# Patient Record
Sex: Female | Born: 1960
Health system: Southern US, Community
[De-identification: ages and names within clinical notes are randomized; demographics above are authoritative.]

## PROBLEM LIST (undated history)

## (undated) DIAGNOSIS — I10 Essential (primary) hypertension: Secondary | ICD-10-CM

## (undated) DIAGNOSIS — T7840XA Allergy, unspecified, initial encounter: Secondary | ICD-10-CM

## (undated) DIAGNOSIS — H269 Unspecified cataract: Secondary | ICD-10-CM

## (undated) HISTORY — PX: TUBAL LIGATION: SHX77

## (undated) HISTORY — DX: Essential (primary) hypertension: I10

## (undated) HISTORY — PX: POLYPECTOMY: SHX149

## (undated) HISTORY — PX: RETINAL DETACHMENT SURGERY: SHX105

## (undated) HISTORY — DX: Allergy, unspecified, initial encounter: T78.40XA

## (undated) HISTORY — PX: COLONOSCOPY: SHX174

## (undated) HISTORY — PX: OTHER SURGICAL HISTORY: SHX169

## (undated) HISTORY — DX: Unspecified cataract: H26.9

---

## 1998-01-18 ENCOUNTER — Other Ambulatory Visit: Admission: RE | Admit: 1998-01-18 | Discharge: 1998-01-18 | Payer: Self-pay | Admitting: Obstetrics and Gynecology

## 1999-01-31 ENCOUNTER — Other Ambulatory Visit: Admission: RE | Admit: 1999-01-31 | Discharge: 1999-01-31 | Payer: Self-pay | Admitting: Obstetrics and Gynecology

## 1999-02-05 ENCOUNTER — Ambulatory Visit (HOSPITAL_COMMUNITY): Admission: RE | Admit: 1999-02-05 | Discharge: 1999-02-05 | Payer: Self-pay | Admitting: Family Medicine

## 1999-02-05 ENCOUNTER — Encounter: Payer: Self-pay | Admitting: Family Medicine

## 2000-02-12 ENCOUNTER — Other Ambulatory Visit: Admission: RE | Admit: 2000-02-12 | Discharge: 2000-02-12 | Payer: Self-pay | Admitting: Obstetrics and Gynecology

## 2001-06-19 ENCOUNTER — Other Ambulatory Visit: Admission: RE | Admit: 2001-06-19 | Discharge: 2001-06-19 | Payer: Self-pay | Admitting: Obstetrics and Gynecology

## 2002-07-01 ENCOUNTER — Other Ambulatory Visit: Admission: RE | Admit: 2002-07-01 | Discharge: 2002-07-01 | Payer: Self-pay | Admitting: Obstetrics and Gynecology

## 2003-08-24 ENCOUNTER — Other Ambulatory Visit: Admission: RE | Admit: 2003-08-24 | Discharge: 2003-08-24 | Payer: Self-pay | Admitting: Obstetrics and Gynecology

## 2010-12-18 ENCOUNTER — Encounter: Payer: Self-pay | Admitting: Internal Medicine

## 2012-04-23 ENCOUNTER — Encounter: Payer: Self-pay | Admitting: Gastroenterology

## 2014-05-16 ENCOUNTER — Encounter: Payer: Self-pay | Admitting: Gastroenterology

## 2016-07-31 DIAGNOSIS — N390 Urinary tract infection, site not specified: Secondary | ICD-10-CM | POA: Diagnosis not present

## 2016-08-08 DIAGNOSIS — H26493 Other secondary cataract, bilateral: Secondary | ICD-10-CM | POA: Diagnosis not present

## 2016-08-13 DIAGNOSIS — M542 Cervicalgia: Secondary | ICD-10-CM | POA: Diagnosis not present

## 2016-08-13 DIAGNOSIS — M9902 Segmental and somatic dysfunction of thoracic region: Secondary | ICD-10-CM | POA: Diagnosis not present

## 2016-08-13 DIAGNOSIS — M9901 Segmental and somatic dysfunction of cervical region: Secondary | ICD-10-CM | POA: Diagnosis not present

## 2016-10-03 DIAGNOSIS — M9901 Segmental and somatic dysfunction of cervical region: Secondary | ICD-10-CM | POA: Diagnosis not present

## 2016-10-03 DIAGNOSIS — M9902 Segmental and somatic dysfunction of thoracic region: Secondary | ICD-10-CM | POA: Diagnosis not present

## 2016-10-03 DIAGNOSIS — M542 Cervicalgia: Secondary | ICD-10-CM | POA: Diagnosis not present

## 2016-10-08 DIAGNOSIS — M9901 Segmental and somatic dysfunction of cervical region: Secondary | ICD-10-CM | POA: Diagnosis not present

## 2016-10-08 DIAGNOSIS — M542 Cervicalgia: Secondary | ICD-10-CM | POA: Diagnosis not present

## 2016-10-08 DIAGNOSIS — M9902 Segmental and somatic dysfunction of thoracic region: Secondary | ICD-10-CM | POA: Diagnosis not present

## 2016-10-30 DIAGNOSIS — J019 Acute sinusitis, unspecified: Secondary | ICD-10-CM | POA: Diagnosis not present

## 2016-11-14 DIAGNOSIS — M62830 Muscle spasm of back: Secondary | ICD-10-CM | POA: Diagnosis not present

## 2016-11-14 DIAGNOSIS — M9901 Segmental and somatic dysfunction of cervical region: Secondary | ICD-10-CM | POA: Diagnosis not present

## 2016-11-14 DIAGNOSIS — M542 Cervicalgia: Secondary | ICD-10-CM | POA: Diagnosis not present

## 2016-12-04 DIAGNOSIS — Z1231 Encounter for screening mammogram for malignant neoplasm of breast: Secondary | ICD-10-CM | POA: Diagnosis not present

## 2016-12-04 DIAGNOSIS — Z0000 Encounter for general adult medical examination without abnormal findings: Secondary | ICD-10-CM | POA: Diagnosis not present

## 2016-12-05 DIAGNOSIS — M542 Cervicalgia: Secondary | ICD-10-CM | POA: Diagnosis not present

## 2016-12-05 DIAGNOSIS — M9901 Segmental and somatic dysfunction of cervical region: Secondary | ICD-10-CM | POA: Diagnosis not present

## 2016-12-05 DIAGNOSIS — M62830 Muscle spasm of back: Secondary | ICD-10-CM | POA: Diagnosis not present

## 2016-12-17 DIAGNOSIS — Z1231 Encounter for screening mammogram for malignant neoplasm of breast: Secondary | ICD-10-CM | POA: Diagnosis not present

## 2016-12-19 DIAGNOSIS — M62830 Muscle spasm of back: Secondary | ICD-10-CM | POA: Diagnosis not present

## 2016-12-19 DIAGNOSIS — M9901 Segmental and somatic dysfunction of cervical region: Secondary | ICD-10-CM | POA: Diagnosis not present

## 2016-12-19 DIAGNOSIS — M542 Cervicalgia: Secondary | ICD-10-CM | POA: Diagnosis not present

## 2017-01-16 DIAGNOSIS — L578 Other skin changes due to chronic exposure to nonionizing radiation: Secondary | ICD-10-CM | POA: Diagnosis not present

## 2017-01-16 DIAGNOSIS — D485 Neoplasm of uncertain behavior of skin: Secondary | ICD-10-CM | POA: Diagnosis not present

## 2017-01-23 DIAGNOSIS — M9901 Segmental and somatic dysfunction of cervical region: Secondary | ICD-10-CM | POA: Diagnosis not present

## 2017-01-23 DIAGNOSIS — M542 Cervicalgia: Secondary | ICD-10-CM | POA: Diagnosis not present

## 2017-01-23 DIAGNOSIS — M62830 Muscle spasm of back: Secondary | ICD-10-CM | POA: Diagnosis not present

## 2017-02-05 DIAGNOSIS — H524 Presbyopia: Secondary | ICD-10-CM | POA: Diagnosis not present

## 2017-02-05 DIAGNOSIS — H26493 Other secondary cataract, bilateral: Secondary | ICD-10-CM | POA: Diagnosis not present

## 2017-02-13 DIAGNOSIS — M542 Cervicalgia: Secondary | ICD-10-CM | POA: Diagnosis not present

## 2017-02-13 DIAGNOSIS — M62830 Muscle spasm of back: Secondary | ICD-10-CM | POA: Diagnosis not present

## 2017-02-13 DIAGNOSIS — M9901 Segmental and somatic dysfunction of cervical region: Secondary | ICD-10-CM | POA: Diagnosis not present

## 2017-03-28 DIAGNOSIS — M25512 Pain in left shoulder: Secondary | ICD-10-CM | POA: Diagnosis not present

## 2017-03-28 DIAGNOSIS — S8002XA Contusion of left knee, initial encounter: Secondary | ICD-10-CM | POA: Diagnosis not present

## 2017-05-08 DIAGNOSIS — M62830 Muscle spasm of back: Secondary | ICD-10-CM | POA: Diagnosis not present

## 2017-05-08 DIAGNOSIS — M9901 Segmental and somatic dysfunction of cervical region: Secondary | ICD-10-CM | POA: Diagnosis not present

## 2017-05-08 DIAGNOSIS — M542 Cervicalgia: Secondary | ICD-10-CM | POA: Diagnosis not present

## 2017-06-16 DIAGNOSIS — M62830 Muscle spasm of back: Secondary | ICD-10-CM | POA: Diagnosis not present

## 2017-06-16 DIAGNOSIS — M542 Cervicalgia: Secondary | ICD-10-CM | POA: Diagnosis not present

## 2017-06-16 DIAGNOSIS — M9901 Segmental and somatic dysfunction of cervical region: Secondary | ICD-10-CM | POA: Diagnosis not present

## 2017-07-03 DIAGNOSIS — H26491 Other secondary cataract, right eye: Secondary | ICD-10-CM | POA: Diagnosis not present

## 2017-07-17 DIAGNOSIS — M62830 Muscle spasm of back: Secondary | ICD-10-CM | POA: Diagnosis not present

## 2017-07-17 DIAGNOSIS — M542 Cervicalgia: Secondary | ICD-10-CM | POA: Diagnosis not present

## 2017-07-17 DIAGNOSIS — M9901 Segmental and somatic dysfunction of cervical region: Secondary | ICD-10-CM | POA: Diagnosis not present

## 2017-07-28 DIAGNOSIS — M542 Cervicalgia: Secondary | ICD-10-CM | POA: Diagnosis not present

## 2017-07-28 DIAGNOSIS — M62830 Muscle spasm of back: Secondary | ICD-10-CM | POA: Diagnosis not present

## 2017-07-28 DIAGNOSIS — M9901 Segmental and somatic dysfunction of cervical region: Secondary | ICD-10-CM | POA: Diagnosis not present

## 2017-11-04 DIAGNOSIS — M542 Cervicalgia: Secondary | ICD-10-CM | POA: Diagnosis not present

## 2017-11-04 DIAGNOSIS — M9901 Segmental and somatic dysfunction of cervical region: Secondary | ICD-10-CM | POA: Diagnosis not present

## 2017-11-04 DIAGNOSIS — M62830 Muscle spasm of back: Secondary | ICD-10-CM | POA: Diagnosis not present

## 2018-01-01 DIAGNOSIS — H26493 Other secondary cataract, bilateral: Secondary | ICD-10-CM | POA: Diagnosis not present

## 2018-03-05 DIAGNOSIS — M654 Radial styloid tenosynovitis [de Quervain]: Secondary | ICD-10-CM | POA: Diagnosis not present

## 2018-04-07 DIAGNOSIS — M545 Low back pain: Secondary | ICD-10-CM | POA: Diagnosis not present

## 2018-04-07 DIAGNOSIS — M9903 Segmental and somatic dysfunction of lumbar region: Secondary | ICD-10-CM | POA: Diagnosis not present

## 2018-04-07 DIAGNOSIS — M9905 Segmental and somatic dysfunction of pelvic region: Secondary | ICD-10-CM | POA: Diagnosis not present

## 2018-08-05 DIAGNOSIS — H26491 Other secondary cataract, right eye: Secondary | ICD-10-CM | POA: Diagnosis not present

## 2018-08-05 DIAGNOSIS — H524 Presbyopia: Secondary | ICD-10-CM | POA: Diagnosis not present

## 2018-09-16 DIAGNOSIS — M25512 Pain in left shoulder: Secondary | ICD-10-CM | POA: Diagnosis not present

## 2018-09-16 DIAGNOSIS — M7501 Adhesive capsulitis of right shoulder: Secondary | ICD-10-CM | POA: Diagnosis not present

## 2019-03-18 ENCOUNTER — Telehealth: Payer: Self-pay | Admitting: Gastroenterology

## 2019-03-18 NOTE — Telephone Encounter (Signed)
Morgan Bradley Can you please give me the colonoscopy report and biopsies Thx RG

## 2019-03-19 NOTE — Telephone Encounter (Signed)
I have put this on your desk for review.  

## 2019-03-24 NOTE — Telephone Encounter (Signed)
Left message for patient to call back and schedule previsit and colonoscopy.

## 2019-03-24 NOTE — Telephone Encounter (Signed)
Can you please schedule this patient with previsit.

## 2019-03-24 NOTE — Telephone Encounter (Signed)
Previous colonoscopy reviewed from October 2015. Overdue for recall.  Letters were sent previously.  Plan: -Proceed with colonoscopy.  thx RG

## 2019-03-26 ENCOUNTER — Encounter: Payer: Self-pay | Admitting: Gastroenterology

## 2019-04-07 ENCOUNTER — Other Ambulatory Visit: Payer: Self-pay

## 2019-04-07 ENCOUNTER — Ambulatory Visit (AMBULATORY_SURGERY_CENTER): Payer: Commercial Managed Care - PPO | Admitting: *Deleted

## 2019-04-07 ENCOUNTER — Other Ambulatory Visit: Payer: Self-pay | Admitting: Internal Medicine

## 2019-04-07 VITALS — Ht 65.0 in | Wt 148.0 lb

## 2019-04-07 DIAGNOSIS — Z86010 Personal history of colonic polyps: Secondary | ICD-10-CM

## 2019-04-07 DIAGNOSIS — N6310 Unspecified lump in the right breast, unspecified quadrant: Secondary | ICD-10-CM

## 2019-04-07 MED ORDER — NA SULFATE-K SULFATE-MG SULF 17.5-3.13-1.6 GM/177ML PO SOLN: 1.0000 | Freq: Once | ORAL | 0 refills | Status: AC

## 2019-04-07 NOTE — Progress Notes (Signed)

## 2019-04-13 ENCOUNTER — Encounter: Payer: Self-pay | Admitting: Gastroenterology

## 2019-04-15 ENCOUNTER — Ambulatory Visit: Payer: Self-pay

## 2019-04-15 ENCOUNTER — Other Ambulatory Visit: Payer: Self-pay

## 2019-04-15 ENCOUNTER — Ambulatory Visit
Admission: RE | Admit: 2019-04-15 | Discharge: 2019-04-15 | Disposition: A | Discharge status: Home / Self Care | Payer: Commercial Managed Care - PPO | Source: Ambulatory Visit | Attending: Internal Medicine | Admitting: Internal Medicine

## 2019-04-15 DIAGNOSIS — N6310 Unspecified lump in the right breast, unspecified quadrant: Secondary | ICD-10-CM

## 2019-04-22 ENCOUNTER — Telehealth: Payer: Self-pay

## 2019-04-22 NOTE — Telephone Encounter (Signed)
Covid-19 screening questions   Do you now or have you had a fever in the last 14 days? NO   Do you have any respiratory symptoms of shortness of breath or cough now or in the last 14 days? NO  Do you have any family members or close contacts with diagnosed or suspected Covid-19 in the past 14 days? NO  Have you been tested for Covid-19 and found to be positive? NO        

## 2019-04-23 ENCOUNTER — Ambulatory Visit (AMBULATORY_SURGERY_CENTER): Payer: Commercial Managed Care - PPO | Admitting: Gastroenterology

## 2019-04-23 ENCOUNTER — Encounter: Payer: Self-pay | Admitting: Gastroenterology

## 2019-04-23 ENCOUNTER — Other Ambulatory Visit: Payer: Self-pay

## 2019-04-23 VITALS — BP 138/73 | HR 72 | Temp 98.2°F | Resp 17 | Ht 65.0 in | Wt 148.0 lb

## 2019-04-23 DIAGNOSIS — Z86010 Personal history of colonic polyps: Secondary | ICD-10-CM | POA: Diagnosis present

## 2019-04-23 DIAGNOSIS — D125 Benign neoplasm of sigmoid colon: Secondary | ICD-10-CM

## 2019-04-23 DIAGNOSIS — D120 Benign neoplasm of cecum: Secondary | ICD-10-CM

## 2019-04-23 DIAGNOSIS — K635 Polyp of colon: Secondary | ICD-10-CM

## 2019-04-23 MED ORDER — SODIUM CHLORIDE 0.9 % IV SOLN: 500.0000 mL | Freq: Once | INTRAVENOUS | Status: DC

## 2019-04-23 NOTE — Op Note (Addendum)
[facility] [place] [facility] Patient Name: Morgan Bradley Procedure Date: 04/23/2019 1:22 PM MRN: [id number] Endoscopist: Jackquline Denmark , MD Age: 58 Referring MD:  Date of Birth: Feb 07, 1961 Gender: Female Account #: [id number] Procedure:                Colonoscopy Indications:              High risk colon cancer surveillance: Personal                            history of serrated colonic polyps. FH of colonic                            polyps (mom) s/p R hemicolectomy Medicines:                Monitored Anesthesia Care Procedure:                Pre-Anesthesia Assessment:                           - Prior to the procedure, a History and Physical                            was performed, and patient medications and                            allergies were reviewed. The patient's tolerance of                            previous anesthesia was also reviewed. The risks                            and benefits of the procedure and the sedation                            options and risks were discussed with the patient.                            All questions were answered, and informed consent                            was obtained. Prior Anticoagulants: The patient has                            taken no previous anticoagulant or antiplatelet                            agents. ASA Grade Assessment: II - A patient with                            mild systemic disease. After reviewing the risks                            and benefits, the patient was deemed in  satisfactory condition to undergo the procedure.                           After obtaining informed consent, the colonoscope                            was passed under direct vision. Throughout the                            procedure, the patient's blood pressure, pulse, and                            oxygen saturations were monitored continuously. The                            Colonoscope was introduced  through the anus and                            advanced to the 2 cm into the ileum. The                            colonoscopy was performed without difficulty. The                            patient tolerated the procedure well. The quality                            of the bowel preparation was adequate to identify                            polyps. Aggressive suctioning and aspiration was                            performed. Overall over 90% with chronic mucus                            visualized satisfactorily. The terminal ileum,                            ileocecal valve, appendiceal orifice, and rectum                            were photographed. Scope In: 1:35:33 PM Scope Out: 2:00:02 PM Scope Withdrawal Time: 0 hours 18 minutes 5 seconds  Total Procedure Duration: 0 hours 24 minutes 29 seconds  Findings:                 Two sessile polyps were found in the mid sigmoid                            colon and cecum. The polyps were 2 to 4 mm in size.                            These polyps were removed with a cold  biopsy                            forceps. Resection and retrieval were complete.                            Estimated blood loss: none.                           The colon (entire examined portion) appeared normal                            except for melanosis coli.                           A few small-mouthed rare diverticula were found in                            the sigmoid colon.                           A scattered area of mildly erythematous mucosa was                            found in the rectum. Could be due to preparation.                            Biopsies were taken with a cold forceps for                            histology. Estimated blood loss: none.                           Non-bleeding internal hemorrhoids were found during                            retroflexion. The hemorrhoids were small.                           The terminal ileum  appeared normal.                           The exam was otherwise without abnormality on                            direct and retroflexion views. Complications:            No immediate complications. Estimated Blood Loss:     Estimated blood loss: none. Impression:               -Small colonic polyps S/P polypectomy.                           -Minimal sigmoid diverticulosis.                           -Erythematous mucosa in the rectum. Biopsied.                            (  Likely due to prep)                           -Otherwise grossly normal colonoscopy to TI. Recommendation:           - Patient has a contact number available for                            emergencies. The signs and symptoms of potential                            delayed complications were discussed with the                            patient. Return to normal activities tomorrow.                            Written discharge instructions were provided to the                            patient.                           - Resume previous diet.                           - Continue present medications.                           - Await pathology results.                           - Repeat colonoscopy in 3 years for surveillance                            based on pathology results. Earlier, if with any                            new problems or change in family history.                           - Return to GI clinic PRN. Jackquline Denmark, MD 04/23/2019 2:16:52 PM This report has been signed electronically.

## 2019-04-23 NOTE — Progress Notes (Signed)
Temperature- [place] RN  Pt's states no medical or surgical changes since previsit or office visit.

## 2019-04-23 NOTE — Progress Notes (Signed)
Called to room to assist during endoscopic procedure.  Patient ID and intended procedure confirmed with present staff. Received instructions for my participation in the procedure from the performing physician.  

## 2019-04-23 NOTE — Patient Instructions (Signed)
Handouts Provided:  Polyps  YOU HAD AN ENDOSCOPIC PROCEDURE TODAY AT THE [facility]:   Refer to the procedure report that was given to you for any specific questions about what was found during the examination.  If the procedure report does not answer your questions, please call your gastroenterologist to clarify.  If you requested that your care partner not be given the details of your procedure findings, then the procedure report has been included in a sealed envelope for you to review at your convenience later.  YOU SHOULD EXPECT: Some feelings of bloating in the abdomen. Passage of more gas than usual.  Walking can help get rid of the air that was put into your GI tract during the procedure and reduce the bloating. If you had a lower endoscopy (such as a colonoscopy or flexible sigmoidoscopy) you may notice spotting of blood in your stool or on the toilet paper. If you underwent a bowel prep for your procedure, you may not have a normal bowel movement for a few days.  Please Note:  You might notice some irritation and congestion in your nose or some drainage.  This is from the oxygen used during your procedure.  There is no need for concern and it should clear up in a day or so.  SYMPTOMS TO REPORT IMMEDIATELY:   Following lower endoscopy (colonoscopy or flexible sigmoidoscopy):  Excessive amounts of blood in the stool  Significant tenderness or worsening of abdominal pains  Swelling of the abdomen that is new, acute  Fever of 100F or higher  For urgent or emergent issues, a gastroenterologist can be reached at any hour by calling [phone].   DIET:  We do recommend a small meal at first, but then you may proceed to your regular diet.  Drink plenty of fluids but you should avoid alcoholic beverages for 24 hours.  ACTIVITY:  You should plan to take it easy for the rest of today and you should NOT DRIVE or use heavy machinery until tomorrow (because of the sedation  medicines used during the test).    FOLLOW UP: Our staff will call the number listed on your records 48-72 hours following your procedure to check on you and address any questions or concerns that you may have regarding the information given to you following your procedure. If we do not reach you, we will leave a message.  We will attempt to reach you two times.  During this call, we will ask if you have developed any symptoms of COVID 19. If you develop any symptoms (ie: fever, flu-like symptoms, shortness of breath, cough etc.) before then, please call [phone].  If you test positive for Covid 19 in the 2 weeks post procedure, please call and report this information to us.    If any biopsies were taken you will be contacted by phone or by letter within the next 1-3 weeks.  Please call us at [phone] if you have not heard about the biopsies in 3 weeks.    SIGNATURES/CONFIDENTIALITY: You and/or your care partner have signed paperwork which will be entered into your electronic medical record.  These signatures attest to the fact that that the information above on your After Visit Summary has been reviewed and is understood.  Full responsibility of the confidentiality of this discharge information lies with you and/or your care-partner.  

## 2019-04-23 NOTE — Progress Notes (Signed)
Report to PACU, RN, vss, BBS= Clear.  

## 2019-04-27 ENCOUNTER — Telehealth: Payer: Self-pay | Admitting: *Deleted

## 2019-04-27 NOTE — Telephone Encounter (Signed)
  Follow up Call-  Call back number 04/23/2019  Post procedure Call Back phone  # [phone]  Permission to leave phone message Yes  Some recent data might be hidden     Patient questions:  Do you have a fever, pain , or abdominal swelling? No. Pain Score  0 *  Have you tolerated food without any problems? Yes.    Have you been able to return to your normal activities? Yes.    Do you have any questions about your discharge instructions: Diet   No. Medications  No. Follow up visit  No.  Do you have questions or concerns about your Care? No.  Actions: * If pain score is 4 or above: No action needed, pain <4.   1. Have you developed a fever since your procedure? no  2.   Have you had an respiratory symptoms (SOB or cough) since your procedure? no  3.   Have you tested positive for COVID 19 since your procedure no  4.   Have you had any family members/close contacts diagnosed with the COVID 19 since your procedure?  no   If yes to any of these questions please route to Joylene John, RN and Alphonsa Gin, Therapist, sports.

## 2019-05-02 ENCOUNTER — Encounter: Payer: Self-pay | Admitting: Gastroenterology

## 2020-02-10 ENCOUNTER — Inpatient Hospital Stay (HOSPITAL_COMMUNITY): Payer: Commercial Managed Care - PPO | Admitting: Certified Registered Nurse Anesthetist

## 2020-02-10 ENCOUNTER — Inpatient Hospital Stay (HOSPITAL_COMMUNITY)
Admission: EM | Admit: 2020-02-10 | Discharge: 2020-02-12 | DRG: 522 | Disposition: A | Discharge status: Home / Self Care | Payer: Commercial Managed Care - PPO | Attending: Orthopaedic Surgery | Admitting: Orthopaedic Surgery

## 2020-02-10 ENCOUNTER — Inpatient Hospital Stay (HOSPITAL_COMMUNITY): Payer: Commercial Managed Care - PPO

## 2020-02-10 ENCOUNTER — Emergency Department (HOSPITAL_COMMUNITY): Payer: Commercial Managed Care - PPO

## 2020-02-10 ENCOUNTER — Other Ambulatory Visit: Payer: Self-pay

## 2020-02-10 ENCOUNTER — Encounter (HOSPITAL_COMMUNITY): Payer: Self-pay | Admitting: Emergency Medicine

## 2020-02-10 ENCOUNTER — Encounter (HOSPITAL_COMMUNITY)
Admission: EM | Disposition: A | Discharge status: Home / Self Care | Payer: Self-pay | Source: Home / Self Care | Attending: Family Medicine

## 2020-02-10 DIAGNOSIS — Z20822 Contact with and (suspected) exposure to covid-19: Secondary | ICD-10-CM | POA: Diagnosis present

## 2020-02-10 DIAGNOSIS — Z7982 Long term (current) use of aspirin: Secondary | ICD-10-CM | POA: Diagnosis not present

## 2020-02-10 DIAGNOSIS — S72001A Fracture of unspecified part of neck of right femur, initial encounter for closed fracture: Secondary | ICD-10-CM

## 2020-02-10 DIAGNOSIS — Z9842 Cataract extraction status, left eye: Secondary | ICD-10-CM

## 2020-02-10 DIAGNOSIS — Z96641 Presence of right artificial hip joint: Secondary | ICD-10-CM

## 2020-02-10 DIAGNOSIS — I10 Essential (primary) hypertension: Secondary | ICD-10-CM

## 2020-02-10 DIAGNOSIS — W1809XA Striking against other object with subsequent fall, initial encounter: Secondary | ICD-10-CM | POA: Diagnosis present

## 2020-02-10 DIAGNOSIS — S72011A Unspecified intracapsular fracture of right femur, initial encounter for closed fracture: Secondary | ICD-10-CM | POA: Diagnosis not present

## 2020-02-10 DIAGNOSIS — Z419 Encounter for procedure for purposes other than remedying health state, unspecified: Secondary | ICD-10-CM

## 2020-02-10 DIAGNOSIS — Z01818 Encounter for other preprocedural examination: Secondary | ICD-10-CM

## 2020-02-10 DIAGNOSIS — Z9841 Cataract extraction status, right eye: Secondary | ICD-10-CM

## 2020-02-10 DIAGNOSIS — J302 Other seasonal allergic rhinitis: Secondary | ICD-10-CM | POA: Diagnosis present

## 2020-02-10 DIAGNOSIS — Z8249 Family history of ischemic heart disease and other diseases of the circulatory system: Secondary | ICD-10-CM

## 2020-02-10 DIAGNOSIS — Y9301 Activity, walking, marching and hiking: Secondary | ICD-10-CM | POA: Diagnosis present

## 2020-02-10 DIAGNOSIS — S72041A Displaced fracture of base of neck of right femur, initial encounter for closed fracture: Secondary | ICD-10-CM

## 2020-02-10 DIAGNOSIS — Y92008 Other place in unspecified non-institutional (private) residence as the place of occurrence of the external cause: Secondary | ICD-10-CM

## 2020-02-10 DIAGNOSIS — Z882 Allergy status to sulfonamides status: Secondary | ICD-10-CM | POA: Diagnosis not present

## 2020-02-10 DIAGNOSIS — D62 Acute posthemorrhagic anemia: Secondary | ICD-10-CM | POA: Diagnosis not present

## 2020-02-10 DIAGNOSIS — Z79899 Other long term (current) drug therapy: Secondary | ICD-10-CM | POA: Diagnosis not present

## 2020-02-10 DIAGNOSIS — Z881 Allergy status to other antibiotic agents status: Secondary | ICD-10-CM | POA: Diagnosis not present

## 2020-02-10 HISTORY — PX: TOTAL HIP ARTHROPLASTY: SHX124

## 2020-02-10 LAB — CBC WITH DIFFERENTIAL/PLATELET
Abs Immature Granulocytes: 0.07 K/uL (ref 0.00–0.07)
Basophils Absolute: 0.1 K/uL (ref 0.0–0.1)
Basophils Relative: 0 %
Eosinophils Absolute: 0 K/uL (ref 0.0–0.5)
Eosinophils Relative: 0 %
HCT: 40.4 % (ref 36.0–46.0)
Hemoglobin: 13.3 g/dL (ref 12.0–15.0)
Immature Granulocytes: 0 %
Lymphocytes Relative: 6 %
Lymphs Abs: 1 K/uL (ref 0.7–4.0)
MCH: 30 pg (ref 26.0–34.0)
MCHC: 32.9 g/dL (ref 30.0–36.0)
MCV: 91 fL (ref 80.0–100.0)
Monocytes Absolute: 0.7 K/uL (ref 0.1–1.0)
Monocytes Relative: 5 %
Neutro Abs: 13.9 K/uL — ABNORMAL HIGH (ref 1.7–7.7)
Neutrophils Relative %: 89 %
Platelets: 244 K/uL (ref 150–400)
RBC: 4.44 MIL/uL (ref 3.87–5.11)
RDW: 13.3 % (ref 11.5–15.5)
WBC: 15.7 K/uL — ABNORMAL HIGH (ref 4.0–10.5)
nRBC: 0 % (ref 0.0–0.2)

## 2020-02-10 LAB — TYPE AND SCREEN
ABO/RH(D): O POS
Antibody Screen: NEGATIVE

## 2020-02-10 LAB — BASIC METABOLIC PANEL
Anion gap: 12 (ref 5–15)
BUN: 15 mg/dL (ref 6–20)
CO2: 28 mmol/L (ref 22–32)
Calcium: 9.9 mg/dL (ref 8.9–10.3)
Chloride: 103 mmol/L (ref 98–111)
Creatinine, Ser: 0.82 mg/dL (ref 0.44–1.00)
GFR calc Af Amer: >60 mL/min (ref >60)
GFR calc non Af Amer: >60 mL/min (ref >60)
Glucose, Bld: 99 mg/dL (ref 70–99)
Potassium: 3.7 mmol/L (ref 3.5–5.1)
Sodium: 143 mmol/L (ref 135–145)

## 2020-02-10 LAB — SURGICAL PCR SCREEN
MRSA, PCR: NEGATIVE
Staphylococcus aureus: NEGATIVE

## 2020-02-10 LAB — SARS CORONAVIRUS 2 BY RT PCR (HOSPITAL ORDER, PERFORMED IN ~~LOC~~ HOSPITAL LAB): SARS Coronavirus 2: NEGATIVE

## 2020-02-10 SURGERY — ARTHROPLASTY, HIP, TOTAL, ANTERIOR APPROACH
Anesthesia: Monitor Anesthesia Care | Site: Hip | Laterality: Right

## 2020-02-10 MED ORDER — HYDROCODONE-ACETAMINOPHEN 5-325 MG PO TABS: 1.0000 | ORAL_TABLET | Freq: Four times a day (QID) | ORAL | Status: DC | PRN

## 2020-02-10 MED ORDER — ONDANSETRON HCL 4 MG/2ML IJ SOLN
INTRAMUSCULAR | Status: AC
  Filled 2020-02-10: qty 2

## 2020-02-10 MED ORDER — PHENOL 1.4 % MT LIQD: 1.0000 | OROMUCOSAL | Status: DC | PRN

## 2020-02-10 MED ORDER — PROPOFOL 10 MG/ML IV BOLUS
INTRAVENOUS | Status: AC
  Filled 2020-02-10: qty 20

## 2020-02-10 MED ORDER — PHENYLEPHRINE HCL (PRESSORS) 10 MG/ML IV SOLN
INTRAVENOUS | Status: AC
  Filled 2020-02-10: qty 1

## 2020-02-10 MED ORDER — POVIDONE-IODINE 10 % EX SWAB
2.0000 "application " | Freq: Once | CUTANEOUS | Status: AC
  Administered 2020-02-10: 2 via TOPICAL

## 2020-02-10 MED ORDER — CEFAZOLIN SODIUM-DEXTROSE 1-4 GM/50ML-% IV SOLN
1.0000 g | Freq: Four times a day (QID) | INTRAVENOUS | Status: AC
  Administered 2020-02-10 – 2020-02-11 (×2): 1 g via INTRAVENOUS
  Filled 2020-02-10 (×2): qty 50

## 2020-02-10 MED ORDER — MIDAZOLAM HCL 5 MG/5ML IJ SOLN
INTRAMUSCULAR | Status: DC | PRN
  Administered 2020-02-10: 2 mg via INTRAVENOUS

## 2020-02-10 MED ORDER — PHENYLEPHRINE HCL-NACL 10-0.9 MG/250ML-% IV SOLN
INTRAVENOUS | Status: DC | PRN
  Administered 2020-02-10: 20 ug/min via INTRAVENOUS

## 2020-02-10 MED ORDER — SODIUM CHLORIDE 0.9 % IV SOLN: INTRAVENOUS | Status: DC

## 2020-02-10 MED ORDER — ONDANSETRON HCL 4 MG/2ML IJ SOLN
4.0000 mg | Freq: Once | INTRAMUSCULAR | Status: AC
  Administered 2020-02-10: 4 mg via INTRAVENOUS
  Filled 2020-02-10: qty 2

## 2020-02-10 MED ORDER — PROPOFOL 500 MG/50ML IV EMUL
INTRAVENOUS | Status: DC | PRN
  Administered 2020-02-10: 50 ug/kg/min via INTRAVENOUS

## 2020-02-10 MED ORDER — CALCIUM CARBONATE 1250 (500 CA) MG PO TABS
1.0000 | ORAL_TABLET | Freq: Every day | ORAL | Status: DC
  Administered 2020-02-11 – 2020-02-12 (×2): 500 mg via ORAL
  Filled 2020-02-10 (×2): qty 1

## 2020-02-10 MED ORDER — TRANEXAMIC ACID-NACL 1000-0.7 MG/100ML-% IV SOLN
INTRAVENOUS | Status: DC | PRN
  Administered 2020-02-10: 1000 mg via INTRAVENOUS

## 2020-02-10 MED ORDER — ACETAMINOPHEN 325 MG PO TABS
325.0000 mg | ORAL_TABLET | Freq: Four times a day (QID) | ORAL | Status: DC | PRN
  Administered 2020-02-11: 650 mg via ORAL
  Filled 2020-02-10: qty 2

## 2020-02-10 MED ORDER — PROPOFOL 10 MG/ML IV BOLUS
INTRAVENOUS | Status: DC | PRN
  Administered 2020-02-10 (×5): 30 mg via INTRAVENOUS

## 2020-02-10 MED ORDER — FENTANYL CITRATE (PF) 100 MCG/2ML IJ SOLN
INTRAMUSCULAR | Status: DC | PRN
  Administered 2020-02-10: 25 ug via INTRAVENOUS
  Administered 2020-02-10: 50 ug via INTRAVENOUS
  Administered 2020-02-10: 25 ug via INTRAVENOUS

## 2020-02-10 MED ORDER — METHOCARBAMOL 500 MG PO TABS
500.0000 mg | ORAL_TABLET | Freq: Four times a day (QID) | ORAL | Status: DC | PRN
  Administered 2020-02-10 – 2020-02-12 (×4): 500 mg via ORAL
  Filled 2020-02-10 (×4): qty 1

## 2020-02-10 MED ORDER — POLYVINYL ALCOHOL 1.4 % OP SOLN
1.0000 [drp] | OPHTHALMIC | Status: DC | PRN
  Filled 2020-02-10: qty 15

## 2020-02-10 MED ORDER — GABAPENTIN 100 MG PO CAPS
100.0000 mg | ORAL_CAPSULE | Freq: Three times a day (TID) | ORAL | Status: DC
  Administered 2020-02-10 – 2020-02-12 (×5): 100 mg via ORAL
  Filled 2020-02-10 (×5): qty 1

## 2020-02-10 MED ORDER — ASPIRIN-ACETAMINOPHEN-CAFFEINE 250-250-65 MG PO TABS
1.0000 | ORAL_TABLET | Freq: Four times a day (QID) | ORAL | Status: DC | PRN
  Filled 2020-02-10: qty 1

## 2020-02-10 MED ORDER — DIPHENHYDRAMINE HCL 12.5 MG/5ML PO ELIX: 12.5000 mg | ORAL_SOLUTION | ORAL | Status: DC | PRN

## 2020-02-10 MED ORDER — ONDANSETRON HCL 4 MG/2ML IJ SOLN
4.0000 mg | Freq: Four times a day (QID) | INTRAMUSCULAR | Status: DC | PRN
  Filled 2020-02-10: qty 2

## 2020-02-10 MED ORDER — FENTANYL CITRATE (PF) 100 MCG/2ML IJ SOLN
INTRAMUSCULAR | Status: AC
  Filled 2020-02-10: qty 2

## 2020-02-10 MED ORDER — CHLORHEXIDINE GLUCONATE 4 % EX LIQD: 60.0000 mL | Freq: Once | CUTANEOUS | Status: DC

## 2020-02-10 MED ORDER — OXYCODONE HCL 5 MG PO TABS: 5.0000 mg | ORAL_TABLET | Freq: Once | ORAL | Status: DC | PRN

## 2020-02-10 MED ORDER — PANTOPRAZOLE SODIUM 40 MG PO TBEC
40.0000 mg | DELAYED_RELEASE_TABLET | Freq: Every day | ORAL | Status: DC
  Administered 2020-02-10 – 2020-02-12 (×3): 40 mg via ORAL
  Filled 2020-02-10 (×3): qty 1

## 2020-02-10 MED ORDER — ENOXAPARIN SODIUM 40 MG/0.4ML ~~LOC~~ SOLN
40.0000 mg | SUBCUTANEOUS | Status: DC
  Administered 2020-02-11: 40 mg via SUBCUTANEOUS
  Filled 2020-02-10: qty 0.4

## 2020-02-10 MED ORDER — ASPIRIN 81 MG PO CHEW
81.0000 mg | CHEWABLE_TABLET | Freq: Two times a day (BID) | ORAL | Status: DC
  Administered 2020-02-10 – 2020-02-12 (×4): 81 mg via ORAL
  Filled 2020-02-10 (×4): qty 1

## 2020-02-10 MED ORDER — METHOCARBAMOL 500 MG IVPB - SIMPLE MED
500.0000 mg | Freq: Four times a day (QID) | INTRAVENOUS | Status: DC | PRN
  Filled 2020-02-10: qty 50

## 2020-02-10 MED ORDER — ALUM & MAG HYDROXIDE-SIMETH 200-200-20 MG/5ML PO SUSP: 30.0000 mL | ORAL | Status: DC | PRN

## 2020-02-10 MED ORDER — ASCORBIC ACID 500 MG PO TABS
1000.0000 mg | ORAL_TABLET | Freq: Every day | ORAL | Status: DC
  Administered 2020-02-11 – 2020-02-12 (×2): 1000 mg via ORAL
  Filled 2020-02-10 (×2): qty 2

## 2020-02-10 MED ORDER — ACETAMINOPHEN 160 MG/5ML PO SOLN: 1000.0000 mg | Freq: Once | ORAL | Status: DC | PRN

## 2020-02-10 MED ORDER — FENTANYL CITRATE (PF) 100 MCG/2ML IJ SOLN: 25.0000 ug | INTRAMUSCULAR | Status: DC | PRN

## 2020-02-10 MED ORDER — OXYCODONE HCL 5 MG PO TABS
5.0000 mg | ORAL_TABLET | ORAL | Status: DC | PRN
  Administered 2020-02-10: 5 mg via ORAL
  Administered 2020-02-11 – 2020-02-12 (×4): 10 mg via ORAL
  Filled 2020-02-10: qty 2
  Filled 2020-02-10: qty 1
  Filled 2020-02-10 (×2): qty 2

## 2020-02-10 MED ORDER — METOCLOPRAMIDE HCL 5 MG/ML IJ SOLN
5.0000 mg | Freq: Three times a day (TID) | INTRAMUSCULAR | Status: DC | PRN
  Administered 2020-02-10: 5 mg via INTRAVENOUS
  Filled 2020-02-10: qty 2

## 2020-02-10 MED ORDER — OXYCODONE HCL 5 MG/5ML PO SOLN: 5.0000 mg | Freq: Once | ORAL | Status: DC | PRN

## 2020-02-10 MED ORDER — TRANEXAMIC ACID-NACL 1000-0.7 MG/100ML-% IV SOLN
INTRAVENOUS | Status: AC
  Filled 2020-02-10: qty 100

## 2020-02-10 MED ORDER — ACETAMINOPHEN 500 MG PO TABS
1000.0000 mg | ORAL_TABLET | Freq: Once | ORAL | Status: AC
  Administered 2020-02-10: 1000 mg via ORAL
  Filled 2020-02-10: qty 2

## 2020-02-10 MED ORDER — ACETAMINOPHEN 10 MG/ML IV SOLN: 1000.0000 mg | Freq: Once | INTRAVENOUS | Status: DC | PRN

## 2020-02-10 MED ORDER — LORATADINE 10 MG PO TABS
10.0000 mg | ORAL_TABLET | Freq: Every day | ORAL | Status: DC
  Administered 2020-02-11 – 2020-02-12 (×2): 10 mg via ORAL
  Filled 2020-02-10 (×2): qty 1

## 2020-02-10 MED ORDER — MORPHINE SULFATE (PF) 4 MG/ML IV SOLN
4.0000 mg | Freq: Once | INTRAVENOUS | Status: AC
  Administered 2020-02-10: 4 mg via INTRAVENOUS
  Filled 2020-02-10: qty 1

## 2020-02-10 MED ORDER — MENTHOL 3 MG MT LOZG: 1.0000 | LOZENGE | OROMUCOSAL | Status: DC | PRN

## 2020-02-10 MED ORDER — LISINOPRIL 10 MG PO TABS
10.0000 mg | ORAL_TABLET | Freq: Every day | ORAL | Status: DC
  Administered 2020-02-11 – 2020-02-12 (×2): 10 mg via ORAL
  Filled 2020-02-10 (×2): qty 1

## 2020-02-10 MED ORDER — 0.9 % SODIUM CHLORIDE (POUR BTL) OPTIME
TOPICAL | Status: DC | PRN
  Administered 2020-02-10: 1000 mL

## 2020-02-10 MED ORDER — MIDAZOLAM HCL 2 MG/2ML IJ SOLN
INTRAMUSCULAR | Status: AC
  Filled 2020-02-10: qty 2

## 2020-02-10 MED ORDER — ACETAMINOPHEN 500 MG PO TABS: 1000.0000 mg | ORAL_TABLET | Freq: Once | ORAL | Status: DC | PRN

## 2020-02-10 MED ORDER — FENTANYL CITRATE (PF) 100 MCG/2ML IJ SOLN
100.0000 ug | Freq: Once | INTRAMUSCULAR | Status: AC
  Administered 2020-02-10: 100 ug via INTRAVENOUS
  Filled 2020-02-10: qty 2

## 2020-02-10 MED ORDER — PROPOFOL 500 MG/50ML IV EMUL
INTRAVENOUS | Status: AC
  Filled 2020-02-10: qty 50

## 2020-02-10 MED ORDER — CEFAZOLIN SODIUM-DEXTROSE 2-4 GM/100ML-% IV SOLN
2.0000 g | INTRAVENOUS | Status: AC
  Administered 2020-02-10: 2 g via INTRAVENOUS
  Filled 2020-02-10: qty 100

## 2020-02-10 MED ORDER — ONDANSETRON HCL 4 MG PO TABS: 4.0000 mg | ORAL_TABLET | Freq: Four times a day (QID) | ORAL | Status: DC | PRN

## 2020-02-10 MED ORDER — MORPHINE SULFATE (PF) 2 MG/ML IV SOLN
0.5000 mg | INTRAVENOUS | Status: DC | PRN
  Administered 2020-02-10: 0.5 mg via INTRAVENOUS
  Filled 2020-02-10: qty 1

## 2020-02-10 MED ORDER — HYDROMORPHONE HCL 1 MG/ML IJ SOLN
0.5000 mg | INTRAMUSCULAR | Status: DC | PRN
  Administered 2020-02-10: 0.5 mg via INTRAVENOUS
  Administered 2020-02-11: 1 mg via INTRAVENOUS
  Filled 2020-02-10 (×2): qty 1

## 2020-02-10 MED ORDER — CHLORHEXIDINE GLUCONATE 0.12 % MT SOLN
15.0000 mL | Freq: Once | OROMUCOSAL | Status: AC
  Administered 2020-02-10: 15 mL via OROMUCOSAL

## 2020-02-10 MED ORDER — BUPIVACAINE IN DEXTROSE 0.75-8.25 % IT SOLN
INTRATHECAL | Status: DC | PRN
  Administered 2020-02-10: 1.8 mL via INTRATHECAL

## 2020-02-10 MED ORDER — DOCUSATE SODIUM 100 MG PO CAPS
100.0000 mg | ORAL_CAPSULE | Freq: Two times a day (BID) | ORAL | Status: DC
  Administered 2020-02-10 – 2020-02-12 (×4): 100 mg via ORAL
  Filled 2020-02-10 (×4): qty 1

## 2020-02-10 MED ORDER — EPHEDRINE 5 MG/ML INJ
INTRAVENOUS | Status: AC
  Filled 2020-02-10: qty 10

## 2020-02-10 MED ORDER — OXYCODONE HCL 5 MG PO TABS
10.0000 mg | ORAL_TABLET | ORAL | Status: DC | PRN
  Administered 2020-02-11: 10 mg via ORAL
  Administered 2020-02-11: 15 mg via ORAL
  Filled 2020-02-10 (×2): qty 3
  Filled 2020-02-10: qty 2

## 2020-02-10 MED ORDER — ONDANSETRON HCL 4 MG/2ML IJ SOLN
INTRAMUSCULAR | Status: DC | PRN
  Administered 2020-02-10: 4 mg via INTRAVENOUS

## 2020-02-10 MED ORDER — METOCLOPRAMIDE HCL 5 MG PO TABS: 5.0000 mg | ORAL_TABLET | Freq: Three times a day (TID) | ORAL | Status: DC | PRN

## 2020-02-10 MED ORDER — ATORVASTATIN CALCIUM 20 MG PO TABS
20.0000 mg | ORAL_TABLET | Freq: Every day | ORAL | Status: DC
  Administered 2020-02-11 – 2020-02-12 (×2): 20 mg via ORAL
  Filled 2020-02-10 (×2): qty 1

## 2020-02-10 MED ORDER — LACTATED RINGERS IV SOLN: INTRAVENOUS | Status: DC

## 2020-02-10 SURGICAL SUPPLY — 41 items
APL SKNCLS STERI-STRIP NONHPOA (GAUZE/BANDAGES/DRESSINGS)
BAG SPEC THK2 15X12 ZIP CLS (MISCELLANEOUS)
BAG ZIPLOCK 12X15 (MISCELLANEOUS) IMPLANT
BENZOIN TINCTURE PRP APPL 2/3 (GAUZE/BANDAGES/DRESSINGS) IMPLANT
BLADE SAW SGTL 18X1.27X75 (BLADE) ×2 IMPLANT
COVER PERINEAL POST (MISCELLANEOUS) ×2 IMPLANT
COVER SURGICAL LIGHT HANDLE (MISCELLANEOUS) ×2 IMPLANT
COVER WAND RF STERILE (DRAPES) ×2 IMPLANT
CUP SECTOR GRIPTON 50MM (Cup) ×1 IMPLANT
DRAPE STERI IOBAN 125X83 (DRAPES) ×2 IMPLANT
DRAPE U-SHAPE 47X51 STRL (DRAPES) ×4 IMPLANT
DRSG AQUACEL AG ADV 3.5X10 (GAUZE/BANDAGES/DRESSINGS) ×2 IMPLANT
DURAPREP 26ML APPLICATOR (WOUND CARE) ×2 IMPLANT
ELECT REM PT RETURN 15FT ADLT (MISCELLANEOUS) ×2 IMPLANT
GAUZE XEROFORM 1X8 LF (GAUZE/BANDAGES/DRESSINGS) ×2 IMPLANT
GLOVE BIO SURGEON STRL SZ7.5 (GLOVE) ×10 IMPLANT
GLOVE BIOGEL PI IND STRL 8 (GLOVE) ×2 IMPLANT
GLOVE BIOGEL PI INDICATOR 8 (GLOVE) ×2
GLOVE ECLIPSE 8.0 STRL XLNG CF (GLOVE) ×2 IMPLANT
GOWN STRL REUS W/TWL XL LVL3 (GOWN DISPOSABLE) ×6 IMPLANT
HANDPIECE INTERPULSE COAX TIP (DISPOSABLE) ×2
HEAD FEMORAL 32 CERAMIC (Hips) ×1 IMPLANT
HOLDER FOLEY CATH W/STRAP (MISCELLANEOUS) ×2 IMPLANT
KIT TURNOVER KIT A (KITS) ×1 IMPLANT
LINER ACET PNNCL PLUS4 NEUTRAL (Hips) IMPLANT
PACK ANTERIOR HIP CUSTOM (KITS) ×1 IMPLANT
PENCIL SMOKE EVACUATOR (MISCELLANEOUS) ×1 IMPLANT
PINNACLE PLUS 4 NEUTRAL (Hips) ×2 IMPLANT
SET HNDPC FAN SPRY TIP SCT (DISPOSABLE) ×1 IMPLANT
STAPLER VISISTAT 35W (STAPLE) ×1 IMPLANT
STEM CORAIL KA12 (Stem) ×1 IMPLANT
STRIP CLOSURE SKIN 1/2X4 (GAUZE/BANDAGES/DRESSINGS) IMPLANT
SUT ETHIBOND NAB CT1 #1 30IN (SUTURE) ×2 IMPLANT
SUT ETHILON 2 0 PS N (SUTURE) IMPLANT
SUT MNCRL AB 4-0 PS2 18 (SUTURE) IMPLANT
SUT VIC AB 0 CT1 36 (SUTURE) ×2 IMPLANT
SUT VIC AB 1 CT1 36 (SUTURE) ×2 IMPLANT
SUT VIC AB 2-0 CT1 27 (SUTURE) ×4
SUT VIC AB 2-0 CT1 TAPERPNT 27 (SUTURE) ×2 IMPLANT
TRAY FOLEY MTR SLVR 16FR STAT (SET/KITS/TRAYS/PACK) ×1 IMPLANT
YANKAUER SUCT BULB TIP 10FT TU (MISCELLANEOUS) ×2 IMPLANT

## 2020-02-10 NOTE — Consult Note (Signed)
Reason for Consult:  Right hip fracture Referring Physician: EDP  Morgan Bradley is an 59 y.o. female.  HPI: The patient is a very active 59 year old female who unfortunately tripped earlier this morning and sustained a hard mechanical fall onto her right hip.  She had inability to ambulate and severe hip pain following that fall.  She was seen at [facility] emergency room this morning and found to have a displaced right hip femoral neck fracture.  I am not on-call, however, one of my colleagues in town who is on-call asked what I take care of the patient since she does need a total hip arthroplasty and that is something that I am quite comfortable forming.  She only reports right hip pain.  She has no other active medical issues at all.  I decided to go ahead and come over and see her today.  The thought was we did put her on the operating room schedule for tomorrow afternoon.  However, I would feel more comfortable with proceeding with surgery this evening since she has been n.p.o. and she is young and otherwise healthy.  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  Her husband is at the bedside as well.  There is no significant medical issues otherwise with her.  She has not eaten since last night.  This fall occurred early this morning.I have reviewed the patient's current medications.  Past Medical History:  Diagnosis Date  . Allergy    seasonal  . Cataract    bilateral-removed  . Hypertension     Past Surgical History:  Procedure Laterality Date  . cataracts     bilateral  . COLONOSCOPY    . POLYPECTOMY    . RETINAL DETACHMENT SURGERY    . TUBAL LIGATION      Family History  Problem Relation Age of Onset  . Colon polyps Mother   . Colon cancer Neg Hx   . Esophageal cancer Neg Hx   . Rectal cancer Neg Hx   . Stomach cancer Neg Hx     Social History:  reports that she has never smoked. She has never used smokeless tobacco. She reports that she does not  drink alcohol and does not use drugs.  Allergies:  Allergies  Allergen Reactions  . Sulfa Antibiotics Hives  . Ciprofloxacin Rash    Medications: I have reviewed the patient's current medications.  Results for orders placed or performed during the hospital encounter of 02/10/20 (from the past 48 hour(s))  CBC with Differential     Status: Abnormal   Collection Time: 02/10/20  9:02 AM  Result Value Ref Range   WBC 15.7 (H) 4.0 - 10.5 K/uL   RBC 4.44 3.87 - 5.11 MIL/uL   Hemoglobin 13.3 12.0 - 15.0 g/dL   HCT 40.4 36 - 46 %   MCV 91.0 80.0 - 100.0 fL   MCH 30.0 26.0 - 34.0 pg   MCHC 32.9 30.0 - 36.0 g/dL   RDW 13.3 11.5 - 15.5 %   Platelets 244 150 - 400 K/uL   nRBC 0.0 0.0 - 0.2 %   Neutrophils Relative % 89 %   Neutro Abs 13.9 (H) 1.7 - 7.7 K/uL   Lymphocytes Relative 6 %   Lymphs Abs 1.0 0.7 - 4.0 K/uL   Monocytes Relative 5 %   Monocytes Absolute 0.7 0 - 1 K/uL   Eosinophils Relative 0 %   Eosinophils Absolute 0.0 0 - 0 K/uL  Basophils Relative 0 %   Basophils Absolute 0.1 0 - 0 K/uL   Immature Granulocytes 0 %   Abs Immature Granulocytes 0.07 0.00 - 0.07 K/uL    Comment: Performed at [facility], [place] [address] [place], [place] [address]  Basic metabolic panel     Status: None   Collection Time: 02/10/20  9:02 AM  Result Value Ref Range   Sodium 143 135 - 145 mmol/L   Potassium 3.7 3.5 - 5.1 mmol/L   Chloride 103 98 - 111 mmol/L   CO2 28 22 - 32 mmol/L   Glucose, Bld 99 70 - 99 mg/dL    Comment: Glucose reference range applies only to samples taken after fasting for at least 8 hours.   BUN 15 6 - 20 mg/dL   Creatinine, Ser 0.82 0.44 - 1.00 mg/dL   Calcium 9.9 8.9 - 10.3 mg/dL   GFR calc non Af Amer >60 >60 mL/min   GFR calc Af Amer >60 >60 mL/min   Anion gap 12 5 - 15    Comment: Performed at [facility], [place] [address], [place], [place] [address]  SARS Coronavirus 2 by RT PCR (hospital order, performed in [facility]  hospital lab) Nasopharyngeal Nasopharyngeal Swab     Status: None   Collection Time: 02/10/20 10:00 AM   Specimen: Nasopharyngeal Swab  Result Value Ref Range   SARS Coronavirus 2 NEGATIVE NEGATIVE    Comment: (NOTE) SARS-CoV-2 target nucleic acids are NOT DETECTED.  The SARS-CoV-2 RNA is generally detectable in upper and lower respiratory specimens during the acute phase of infection. The lowest concentration of SARS-CoV-2 viral copies this assay can detect is 250 copies / mL. A negative result does not preclude SARS-CoV-2 infection and should not be used as the sole basis for treatment or other patient management decisions.  A negative result may occur with improper specimen collection / handling, submission of specimen other than nasopharyngeal swab, presence of viral mutation(s) within the areas targeted by this assay, and inadequate number of viral copies (<250 copies / mL). A negative result must be combined with clinical observations, patient history, and epidemiological information.  Fact Sheet for Patients:   StrictlyIdeas.no  Fact Sheet for Healthcare Providers: BankingDealers.co.za  This test is not yet approved or  cleared by the [place] FDA and has been authorized for detection and/or diagnosis of SARS-CoV-2 by FDA under an Emergency Use Authorization (EUA).  This EUA will remain in effect (meaning this test can be used) for the duration of the COVID-19 declaration under Section 564(b)(1) of the Act, 21 U.S.C. section 360bbb-3(b)(1), unless the authorization is terminated or revoked sooner.  Performed at [facility], [place] [address], [place], [place] [address]     DG Elbow Complete Right  Result Date: 02/10/2020 CLINICAL DATA:  Pain following fall EXAM: RIGHT ELBOW - COMPLETE 3+ VIEW COMPARISON:  None. FINDINGS: Frontal, lateral, and bilateral oblique views were obtained. There is no  appreciable fracture or dislocation. No joint effusion. Joint spaces appear normal. No erosive change. IMPRESSION: No fracture or dislocation.  No evident arthropathy. Electronically Signed   By: Lowella Grip III M.D.   On: 02/10/2020 09:51   CT Head Wo Contrast  Result Date: 02/10/2020 CLINICAL DATA:  Posttraumatic headache after fall today. Positive loss of consciousness. EXAM: CT HEAD WITHOUT CONTRAST CT CERVICAL SPINE WITHOUT CONTRAST TECHNIQUE: Multidetector CT imaging of the head and cervical spine was performed following the standard protocol without  intravenous contrast. Multiplanar CT image reconstructions of the cervical spine were also generated. COMPARISON:  None. FINDINGS: CT HEAD FINDINGS Brain: No evidence of acute infarction, hemorrhage, hydrocephalus, extra-axial collection or mass lesion/mass effect. Vascular: No hyperdense vessel or unexpected calcification. Skull: Normal. Negative for fracture or focal lesion. Sinuses/Orbits: No acute finding. Other: None. CT CERVICAL SPINE FINDINGS Alignment: Minimal grade 1 anterolisthesis of C3-4 is noted secondary to posterior facet joint hypertrophy. Skull base and vertebrae: No acute fracture. No primary bone lesion or focal pathologic process. Soft tissues and spinal canal: No prevertebral fluid or swelling. No visible canal hematoma. Disc levels: Mild degenerative disc disease is noted at C5-6 with anterior and posterior osteophyte formation. Upper chest: Negative. Other: None. IMPRESSION: 1. Normal head CT. 2. Mild degenerative disc disease is noted at C5-6. No acute abnormality seen in the cervical spine. Electronically Signed   By: Marijo Conception M.D.   On: 02/10/2020 10:06   CT Cervical Spine Wo Contrast  Result Date: 02/10/2020 CLINICAL DATA:  Posttraumatic headache after fall today. Positive loss of consciousness. EXAM: CT HEAD WITHOUT CONTRAST CT CERVICAL SPINE WITHOUT CONTRAST TECHNIQUE: Multidetector CT imaging of the head and  cervical spine was performed following the standard protocol without intravenous contrast. Multiplanar CT image reconstructions of the cervical spine were also generated. COMPARISON:  None. FINDINGS: CT HEAD FINDINGS Brain: No evidence of acute infarction, hemorrhage, hydrocephalus, extra-axial collection or mass lesion/mass effect. Vascular: No hyperdense vessel or unexpected calcification. Skull: Normal. Negative for fracture or focal lesion. Sinuses/Orbits: No acute finding. Other: None. CT CERVICAL SPINE FINDINGS Alignment: Minimal grade 1 anterolisthesis of C3-4 is noted secondary to posterior facet joint hypertrophy. Skull base and vertebrae: No acute fracture. No primary bone lesion or focal pathologic process. Soft tissues and spinal canal: No prevertebral fluid or swelling. No visible canal hematoma. Disc levels: Mild degenerative disc disease is noted at C5-6 with anterior and posterior osteophyte formation. Upper chest: Negative. Other: None. IMPRESSION: 1. Normal head CT. 2. Mild degenerative disc disease is noted at C5-6. No acute abnormality seen in the cervical spine. Electronically Signed   By: Marijo Conception M.D.   On: 02/10/2020 10:06   DG Hip Unilat W or Wo Pelvis 2-3 Views Right  Result Date: 02/10/2020 CLINICAL DATA:  Pain following fall EXAM: DG HIP (WITH OR WITHOUT PELVIS) 2-3V RIGHT COMPARISON:  None. FINDINGS: Frontal pelvis as well as frontal and lateral views obtained. There is a comminuted subcapital femoral neck fracture on the right with a degree of impaction at fracture site. No other fracture. No dislocation. There is mild symmetric narrowing of each hip joint. IMPRESSION: Comminuted subcapital femoral neck fracture on the right with degree of impaction at the fracture site. No other fractures evident. No dislocation. Mild symmetric narrowing each hip joint. Electronically Signed   By: Lowella Grip III M.D.   On: 02/10/2020 09:50    Review of Systems  All other systems  reviewed and are negative.  Blood pressure 113/89, pulse 89, temperature (!) 100.4 F (38 C), temperature source Oral, resp. rate 18, height 5\' 5"  (1.651 m), weight 64.9 kg, SpO2 95 %. Physical Exam Vitals reviewed.  Constitutional:      Appearance: Normal appearance. She is normal weight.  HENT:     Head: Normocephalic.  Eyes:     Pupils: Pupils are equal, round, and reactive to light.  Cardiovascular:     Rate and Rhythm: Normal rate and regular rhythm.  Pulmonary:  Effort: Pulmonary effort is normal.     Breath sounds: Normal breath sounds.  Abdominal:     Palpations: Abdomen is soft.  Musculoskeletal:     Cervical back: Normal range of motion.     Right hip: Deformity, tenderness and bony tenderness present. Decreased range of motion. Decreased strength.  Neurological:     Mental Status: She is alert and oriented to person, place, and time.  Psychiatric:        Behavior: Behavior normal.     Assessment/Plan: Displaced right hip femoral neck fracture  I explained to the patient in detail that my recommendation for treatment of this injury would be a right total hip arthroplasty through a direct anterior approach.  The femoral neck fracture is comminuted and displaced.  This has a low chance of healing and would likely lead to nonunion.  I explained the different surgical options including cannulated screw placement but I do not feel this is in her best interest given the nature of this injury.  I explained the risk and benefits of surgery in detail as well as what her operative and postoperative course would entail.  All question concerns were answered and addressed.  We will keep her n.p.o. and I will schedule her for surgery this evening after 5 PM.  All questions and concerns were answered and addressed.  Mcarthur Rossetti 02/10/2020, 12:15 PM

## 2020-02-10 NOTE — ED Triage Notes (Signed)
BIBA  Per EMS:  Allergy to bactrim and Cipro.   Tripped over trailer this morning at 5am. Did have LOC. Pt thinks LOC from pain. No bruising/bleeding per EMS.   Vitals 110/70  64 HR 18 RR 99% room air Good Pedal pulse

## 2020-02-10 NOTE — Op Note (Signed)
NAMEMarland Kitchen MARGALIT, LEECE MEDICAL RECORD OI:[id number] ACCOUNT [id number] DATE OF BIRTH:1961/01/22 FACILITY: [facility] LOCATION: [facility] PHYSICIAN:Freddy Kinne Kerry Fort, MD  OPERATIVE REPORT  DATE OF PROCEDURE:  02/10/2020  PREOPERATIVE DIAGNOSIS:  Right hip comminuted displaced femoral neck fracture.  POSTOPERATIVE DIAGNOSIS:  Right hip comminuted displaced femoral neck fracture.  PROCEDURE:  Right total hip arthroplasty through direct anterior approach.  IMPLANTS:  DePuy Sector Gription acetabular component size 50, size 32+4 neutral polyethylene liner, size 12 Corail femoral component with standard offset, size 32+1 ceramic hip ball.  SURGEON:  Lind Guest. Ninfa Linden, MD  ASSISTANT:  Erskine Emery, PA-C  ANESTHESIA:  Spinal.  ANTIBIOTICS:  Two g IV Ancef.  ESTIMATED BLOOD LOSS:  300 mL.  COMPLICATIONS:  None.  INDICATIONS:  The patient is a very pleasant and active 59 year old female who sustained a hard mechanical fall onto her right hip today.  This was an accidental fall.  She had the inability to bear weight on her right lower extremity after that and was  brought to the [facility].  X-ray showed a comminuted and displaced femoral neck fracture.  I was consulted since I do hip replacement surgery.  I talked to her in length about the options being open reduction with cannulated screw  fixation versus a total hip arthroplasty.  My concern is with the displaced nature of the fracture and the comminution that she would not heal cannulated screws.  She is very active 59 years old and I talked about the option of anterior hip replacement  surgery and she agreed with this option, as did her husband.  I think this is more reasonable given the displaced nature and comminuted nature of the fracture.  Of note, I did explain in detail the risk of acute blood loss anemia, nerve or vessel injury,  fracture, infection, DVT, implant failure and dislocation.  She understands our  goals are decreasing her pain, improve mobility and overall improve quality of life.   DESCRIPTION OF PROCEDURE:  After informed consent was obtained, the appropriate right hip was marked.  She was brought to the operating room and spinal anesthesia was obtained while she was on a stretcher.  She was laid in supine position on a stretcher.   A Foley catheter was placed and traction boots were placed on both her feet.  Next, she was placed supine on the Hana fracture table with the perineal post in place and both legs in in-line skeletal traction device and no traction applied.  Her right  operative hip was prepped and draped with DuraPrep and sterile drapes.  A timeout was called and she was identified as correct patient, correct right hip.  I then made an incision just inferior and posterior to the anterior iliac spine and carried this  obliquely down the leg.  We dissected down to the tensor fascia lata muscle.  Tensor fascia was then divided longitudinally to proceed with direct anterior approach to the hip.  We identified and cauterized circumflex vessels and identified the hip  capsule, opened up the hip capsule in an L-type format, finding a moderate hemarthrosis and significant displacement of the femoral neck with comminution.  We removed the hemarthrosis and then placed Cobra retractors around the medial and lateral  remnants of the femoral neck.  We were able to make a freshening femoral neck cut just proximal to the lesser trochanter, but distal to the femoral neck fracture.  We completed this with an osteotome and placed a corkscrew guide in the  femoral head and  removed the femoral head in its entirety.  I then removed remnants of the acetabular labrum and other debris and began reaming under direct visualization from a size 44 reamer in stepwise increments, going up to a size 49, with all reamers regular under  direct visualization, the last reamer under direct fluoroscopy so I could obtain my  depth of reaming by inclination and anteversion.  I then placed the real DePuy Sector Gription acetabular component size 50 and a 32+4 neutral polyethylene liner for  having medialized the cup.  I then had attention turned to the femur.  With the leg externally rotated to 120 degrees, extended and adducted, we are able to place a Mueller retractor medially and a Hohman retractor behind the greater trochanter, released  lateral joint capsule and used a box-cutting osteotome to enter the femoral canal and a rongeur to lateralize.  We then began broaching using the Corail broaching system from a size 8 going up to a size 12.  With a size 12 in place, we trialed a  standard offset femoral neck and a 32+1 hip ball, reduced this in the acetabulum and were pleased with leg length, offset, range of motion and stability assessed mechanically and radiographically.  We then dislocated the hip and removed the trial  components.  We placed the real Corail femoral component with standard offset size 12 and the real 32+1 ceramic hip ball, again reduced this in the acetabulum and it was stable assessed mechanically and radiographically.  We then irrigated the soft  tissue with normal saline solution.  We closed the joint capsule with interrupted #1 Ethibond suture, followed by #1 Vicryl to close the tensor fascia, 0 Vicryl was used to close deep tissue, 2-0 Vicryl was used to close subcutaneous tissue.  Interrupted  staples were used to reapproximate the skin.  Xeroform and Aquacel dressing was applied.  She was taken off the Hana table and taken to the recovery room in stable condition.  All final counts were correct.  There were no complications noted.  Of note,  Benita Stabile, PA-C, assisted during the entire case and his assistance was crucial for facilitating all aspects of this case.  VN/NUANCE  D:02/10/2020 T:02/10/2020 JOB:[id number]

## 2020-02-10 NOTE — H&P (Signed)
History and Physical    [name] GBT:[id number] DOB: April 18, 1961 DOA: 02/10/2020  PCP: Nicoletta Dress, MD  Patient coming from: Home  Chief Complaint: Fall, R hip pain  HPI: Morgan Bradley is a 59 y.o. female with medical history significant of seasonal allergies, HTN presents to ED after mechanical fall the morning of admit. Around 5AM, pt was walking down driveway when she tripped over part of lawnmower that blended into surrounding. This resulted in pt falling onto R side with resulting pain. Patient was able to walk up flight of stairs, attempting to get ready for work. Ultimately, pt decided to go to ED for further work up. Denies LOC, sob, chest pain   ED Course: In the ED, pt found to have comminuted subcapital femoral neck fracture on the R with degree of impaction at the fracture site. Orthopedic Surgery consulted. Hospitalist consulted for consideration for admission.  Review of Systems:  Review of Systems  Constitutional: Negative for chills, fever, malaise/fatigue and weight loss.  HENT: Negative for ear discharge, ear pain, nosebleeds and tinnitus.   Eyes: Negative for double vision and photophobia.  Respiratory: Negative for hemoptysis, sputum production and shortness of breath.   Cardiovascular: Negative for chest pain, palpitations and leg swelling.  Gastrointestinal: Negative for abdominal pain, nausea and vomiting.  Genitourinary: Negative for flank pain, frequency and hematuria.  Musculoskeletal: Positive for falls and joint pain.  Neurological: Negative for tingling, tremors, seizures, loss of consciousness and weakness.  Psychiatric/Behavioral: Negative for hallucinations and memory loss. The patient is not nervous/anxious.     Past Medical History:  Diagnosis Date   Allergy    seasonal   Cataract    bilateral-removed   Hypertension     Past Surgical History:  Procedure Laterality Date   cataracts     bilateral   COLONOSCOPY      POLYPECTOMY     RETINAL DETACHMENT SURGERY     TUBAL LIGATION       reports that she has never smoked. She has never used smokeless tobacco. She reports that she does not drink alcohol and does not use drugs.  Allergies  Allergen Reactions   Sulfa Antibiotics Hives   Ciprofloxacin Rash    Family History  Problem Relation Age of Onset   Colon polyps Mother    Colon cancer Neg Hx    Esophageal cancer Neg Hx    Rectal cancer Neg Hx    Stomach cancer Neg Hx   Pt does report significant cardiac history throughout most of family  Prior to Admission medications   Medication Sig Start Date End Date Taking? Authorizing Provider  Ascorbic Acid (VITAMIN C) 1000 MG tablet Take 1,000 mg by mouth daily.   Yes [provider]  aspirin EC 81 MG tablet Take 81 mg by mouth daily.   Yes [provider]  aspirin-acetaminophen-caffeine (EXCEDRIN MIGRAINE) [phone] MG tablet Take 1 tablet by mouth every 6 (six) hours as needed for headache.   Yes [provider]  atorvastatin (LIPITOR) 20 MG tablet Take 20 mg by mouth daily. 01/13/20  Yes [provider]  calcium carbonate (OSCAL) 1500 (600 Ca) MG TABS tablet Take 600 mg by mouth daily with breakfast.   Yes [provider]  cetirizine (ZYRTEC) 10 MG tablet Take 10 mg by mouth daily.   Yes [provider]  lisinopril (ZESTRIL) 10 MG tablet Take 10 mg by mouth daily.  02/19/19  Yes [provider]  magnesium oxide (MAG-OX) 400 MG tablet  Take 400 mg by mouth daily.   Yes [provider]  Misc Natural Products (PUMPKIN SEED OIL) CAPS Take 1 capsule by mouth daily.   Yes [provider]  OSPHENA 60 MG TABS Take 60 mg by mouth daily.  01/13/19  Yes [provider]  polyvinyl alcohol (LIQUIFILM TEARS) 1.4 % ophthalmic solution Place 1 drop into both eyes as needed for dry eyes.   Yes [provider]    Physical Exam: Vitals:   02/10/20 0901 02/10/20  0905 02/10/20 0906 02/10/20 1117  BP: 134/84   113/89  Pulse: 70   89  Resp: 18   18  Temp: (!) 100.4 F (38 C)     TempSrc: Oral     SpO2: 100% 100%  95%  Weight: 64.9 kg  64.9 kg   Height: 5\' 5"  (1.651 m)  5\' 5"  (1.651 m)     Constitutional: NAD, calm, comfortable Vitals:   02/10/20 0901 02/10/20 0905 02/10/20 0906 02/10/20 1117  BP: 134/84   113/89  Pulse: 70   89  Resp: 18   18  Temp: (!) 100.4 F (38 C)     TempSrc: Oral     SpO2: 100% 100%  95%  Weight: 64.9 kg  64.9 kg   Height: 5\' 5"  (1.651 m)  5\' 5"  (1.651 m)    Eyes: PERRL, lids and conjunctivae normal ENMT: Mucous membranes are moist. Dentition fair Neck: normal, supple, no masses, no thyromegaly Respiratory: clear to auscultation bilaterally, no wheezing. Normal respiratory effort. No accessory muscle use.  Cardiovascular: Regular rate and rhythm, No extremity edema. 2+ pedal pulses. No carotid bruits.  Abdomen: no tenderness, no masses palpated. No hepatosplenomegaly. Bowel sounds positive.  Musculoskeletal: no clubbing. Good ROM, Normal muscle tone.  Skin: no rashes, lesions, ulcers. No induration Neurologic: CN 2-12 grossly intact. Sensation intact. Strength 5/5 in all 4.  Psychiatric: Normal judgment and insight. Alert and oriented x 3. Normal mood.   Labs on Admission: I have personally reviewed following labs and imaging studies  CBC: Recent Labs  Lab 02/10/20 0902  WBC 15.7*  NEUTROABS 13.9*  HGB 13.3  HCT 40.4  MCV 91.0  PLT 240   Basic Metabolic Panel: Recent Labs  Lab 02/10/20 0902  NA 143  K 3.7  CL 103  CO2 28  GLUCOSE 99  BUN 15  CREATININE 0.82  CALCIUM 9.9   GFR: Estimated Creatinine Clearance: 68.1 mL/min (by C-G formula based on SCr of 0.82 mg/dL). Liver Function Tests: No results for input(s): AST, ALT, ALKPHOS, BILITOT, PROT, ALBUMIN in the last 168 hours. No results for input(s): LIPASE, AMYLASE in the last 168 hours. No results for input(s): AMMONIA in the last 168  hours. Coagulation Profile: No results for input(s): INR, PROTIME in the last 168 hours. Cardiac Enzymes: No results for input(s): CKTOTAL, CKMB, CKMBINDEX, TROPONINI in the last 168 hours. BNP (last 3 results) No results for input(s): PROBNP in the last 8760 hours. HbA1C: No results for input(s): HGBA1C in the last 72 hours. CBG: No results for input(s): GLUCAP in the last 168 hours. Lipid Profile: No results for input(s): CHOL, HDL, LDLCALC, TRIG, CHOLHDL, LDLDIRECT in the last 72 hours. Thyroid Function Tests: No results for input(s): TSH, T4TOTAL, FREET4, T3FREE, THYROIDAB in the last 72 hours. Anemia Panel: No results for input(s): VITAMINB12, FOLATE, FERRITIN, TIBC, IRON, RETICCTPCT in the last 72 hours. Urine analysis: No results found for: COLORURINE, APPEARANCEUR, LABSPEC, [place], GLUCOSEU, HGBUR, BILIRUBINUR, KETONESUR, PROTEINUR, UROBILINOGEN, NITRITE,  LEUKOCYTESUR Sepsis Labs: !!!!!!!!!!!!!!!!!!!!!!!!!!!!!!!!!!!!!!!!!!!! @LABRCNTIP (procalcitonin:4,lacticidven:4) ) Recent Results (from the past 240 hour(s))  SARS Coronavirus 2 by RT PCR (hospital order, performed in [facility] hospital lab) Nasopharyngeal Nasopharyngeal Swab     Status: None   Collection Time: 02/10/20 10:00 AM   Specimen: Nasopharyngeal Swab  Result Value Ref Range Status   SARS Coronavirus 2 NEGATIVE NEGATIVE Final    Comment: (NOTE) SARS-CoV-2 target nucleic acids are NOT DETECTED.  The SARS-CoV-2 RNA is generally detectable in upper and lower respiratory specimens during the acute phase of infection. The lowest concentration of SARS-CoV-2 viral copies this assay can detect is 250 copies / mL. A negative result does not preclude SARS-CoV-2 infection and should not be used as the sole basis for treatment or other patient management decisions.  A negative result may occur with improper specimen collection / handling, submission of specimen other than nasopharyngeal swab, presence of viral  mutation(s) within the areas targeted by this assay, and inadequate number of viral copies (<250 copies / mL). A negative result must be combined with clinical observations, patient history, and epidemiological information.  Fact Sheet for Patients:   StrictlyIdeas.no  Fact Sheet for Healthcare Providers: BankingDealers.co.za  This test is not yet approved or  cleared by the [place] FDA and has been authorized for detection and/or diagnosis of SARS-CoV-2 by FDA under an Emergency Use Authorization (EUA).  This EUA will remain in effect (meaning this test can be used) for the duration of the COVID-19 declaration under Section 564(b)(1) of the Act, 21 U.S.C. section 360bbb-3(b)(1), unless the authorization is terminated or revoked sooner.  Performed at [facility], [place] [address] [place], [place] [address]      Radiological Exams on Admission: DG Elbow Complete Right  Result Date: 02/10/2020 CLINICAL DATA:  Pain following fall EXAM: RIGHT ELBOW - COMPLETE 3+ VIEW COMPARISON:  None. FINDINGS: Frontal, lateral, and bilateral oblique views were obtained. There is no appreciable fracture or dislocation. No joint effusion. Joint spaces appear normal. No erosive change. IMPRESSION: No fracture or dislocation.  No evident arthropathy. Electronically Signed   By: Lowella Grip III M.D.   On: 02/10/2020 09:51   CT Head Wo Contrast  Result Date: 02/10/2020 CLINICAL DATA:  Posttraumatic headache after fall today. Positive loss of consciousness. EXAM: CT HEAD WITHOUT CONTRAST CT CERVICAL SPINE WITHOUT CONTRAST TECHNIQUE: Multidetector CT imaging of the head and cervical spine was performed following the standard protocol without intravenous contrast. Multiplanar CT image reconstructions of the cervical spine were also generated. COMPARISON:  None. FINDINGS: CT HEAD FINDINGS Brain: No evidence of acute infarction,  hemorrhage, hydrocephalus, extra-axial collection or mass lesion/mass effect. Vascular: No hyperdense vessel or unexpected calcification. Skull: Normal. Negative for fracture or focal lesion. Sinuses/Orbits: No acute finding. Other: None. CT CERVICAL SPINE FINDINGS Alignment: Minimal grade 1 anterolisthesis of C3-4 is noted secondary to posterior facet joint hypertrophy. Skull base and vertebrae: No acute fracture. No primary bone lesion or focal pathologic process. Soft tissues and spinal canal: No prevertebral fluid or swelling. No visible canal hematoma. Disc levels: Mild degenerative disc disease is noted at C5-6 with anterior and posterior osteophyte formation. Upper chest: Negative. Other: None. IMPRESSION: 1. Normal head CT. 2. Mild degenerative disc disease is noted at C5-6. No acute abnormality seen in the cervical spine. Electronically Signed   By: Marijo Conception M.D.   On: 02/10/2020 10:06   CT Cervical Spine Wo Contrast  Result Date: 02/10/2020 CLINICAL DATA:  Posttraumatic headache after  fall today. Positive loss of consciousness. EXAM: CT HEAD WITHOUT CONTRAST CT CERVICAL SPINE WITHOUT CONTRAST TECHNIQUE: Multidetector CT imaging of the head and cervical spine was performed following the standard protocol without intravenous contrast. Multiplanar CT image reconstructions of the cervical spine were also generated. COMPARISON:  None. FINDINGS: CT HEAD FINDINGS Brain: No evidence of acute infarction, hemorrhage, hydrocephalus, extra-axial collection or mass lesion/mass effect. Vascular: No hyperdense vessel or unexpected calcification. Skull: Normal. Negative for fracture or focal lesion. Sinuses/Orbits: No acute finding. Other: None. CT CERVICAL SPINE FINDINGS Alignment: Minimal grade 1 anterolisthesis of C3-4 is noted secondary to posterior facet joint hypertrophy. Skull base and vertebrae: No acute fracture. No primary bone lesion or focal pathologic process. Soft tissues and spinal canal: No  prevertebral fluid or swelling. No visible canal hematoma. Disc levels: Mild degenerative disc disease is noted at C5-6 with anterior and posterior osteophyte formation. Upper chest: Negative. Other: None. IMPRESSION: 1. Normal head CT. 2. Mild degenerative disc disease is noted at C5-6. No acute abnormality seen in the cervical spine. Electronically Signed   By: Marijo Conception M.D.   On: 02/10/2020 10:06   DG Hip Unilat W or Wo Pelvis 2-3 Views Right  Result Date: 02/10/2020 CLINICAL DATA:  Pain following fall EXAM: DG HIP (WITH OR WITHOUT PELVIS) 2-3V RIGHT COMPARISON:  None. FINDINGS: Frontal pelvis as well as frontal and lateral views obtained. There is a comminuted subcapital femoral neck fracture on the right with a degree of impaction at fracture site. No other fracture. No dislocation. There is mild symmetric narrowing of each hip joint. IMPRESSION: Comminuted subcapital femoral neck fracture on the right with degree of impaction at the fracture site. No other fractures evident. No dislocation. Mild symmetric narrowing each hip joint. Electronically Signed   By: Lowella Grip III M.D.   On: 02/10/2020 09:50    EKG: Independently reviewed. Ordered, pending  Assessment/Plan Principal Problem:   Closed right hip fracture (HCC) Active Problems:   HTN (hypertension)   Family hx of ischem heart dis and oth dis of the circ sys   Seasonal allergies  1. R hip fracture 1. S/p mechanical fall on the morning of admit, tripping on lawnmower equipment 2. Xray personally reviewed, findings of comminuted femoral neck fracture on R 3. Orthopedic Surgery consulted by EDP. Discussed with EDP.  4. Per Orthopedic Surgery, plan for surgery later today. Pt NPO 5. Continue with analgesics as needed 2. HTN 1. BP stable at present 2. Continue lisinopril per home med 3. Seasonal allergies 1. Stable 2. Cont antihistamine per home regimen 4. Family hx of heart disease 1. Denies chest pains 2. Have  ordered EKG for pre-op eval  DVT prophylaxis: Lovenox subq  Code Status: Full Family Communication: Pt in room, family not at bedside  Disposition Plan: Uncertain at this time  Consults called: Orthopedic Surgery per EDP Admission status: Inpatient, as would likely require greater than 2 midnight stay to treat R hip fracture, requiring srugery   Marylu Lund MD [facility] Pager On Amion  If 7PM-7AM, please contact night-coverage  02/10/2020, 11:55 AM

## 2020-02-10 NOTE — Anesthesia Preprocedure Evaluation (Signed)
Anesthesia Evaluation  Patient identified by MRN, date of birth, ID band Patient awake    Reviewed: Allergy & Precautions, NPO status , Patient's Chart, lab work & pertinent test results  History of Anesthesia Complications Negative for: history of anesthetic complications  Airway Mallampati: II  TM Distance: >3 FB Neck ROM: Full    Dental  (+) Dental Advisory Given, Teeth Intact   Pulmonary neg pulmonary ROS, neg recent URI,    breath sounds clear to auscultation       Cardiovascular hypertension, Pt. on medications (-) angina(-) Past MI and (-) CHF  Rhythm:Regular     Neuro/Psych negative neurological ROS  negative psych ROS   GI/Hepatic negative GI ROS, Neg liver ROS,   Endo/Other  negative endocrine ROS  Renal/GU negative Renal ROS     Musculoskeletal Right hip fracture   Abdominal   Peds  Hematology negative hematology ROS (+)   Anesthesia Other Findings   Reproductive/Obstetrics                             Anesthesia Physical Anesthesia Plan  ASA: II  Anesthesia Plan: MAC and Spinal   Post-op Pain Management:    Induction:   PONV Risk Score and Plan: 2 and Treatment may vary due to age or medical condition and Propofol infusion  Airway Management Planned: Nasal Cannula  Additional Equipment: None  Intra-op Plan:   Post-operative Plan:   Informed Consent: I have reviewed the patients History and Physical, chart, labs and discussed the procedure including the risks, benefits and alternatives for the proposed anesthesia with the patient or authorized representative who has indicated his/her understanding and acceptance.     Dental advisory given  Plan Discussed with: CRNA and Surgeon  Anesthesia Plan Comments:         Anesthesia Quick Evaluation

## 2020-02-10 NOTE — Transfer of Care (Signed)
Immediate Anesthesia Transfer of Care Note  Patient: Morgan Bradley  Procedure(s) Performed: TOTAL HIP ARTHROPLASTY ANTERIOR APPROACH (Right Hip)  Patient Location: PACU  Anesthesia Type:Spinal  Level of Consciousness: drowsy and patient cooperative  Airway & Oxygen Therapy: Patient Spontanous Breathing and Patient connected to face mask oxygen  Post-op Assessment: Report given to RN and Post -op Vital signs reviewed and stable  Post vital signs: Reviewed and stable  Last Vitals:  Vitals Value Taken Time  BP 116/77 02/10/20 1936  Temp    Pulse 101 02/10/20 1936  Resp 10 02/10/20 1936  SpO2 100 % 02/10/20 1936  Vitals shown include unvalidated device data.  Last Pain:  Vitals:   02/10/20 1644  TempSrc: Oral  PainSc:       Patients Stated Pain Goal: 2 (14/84/03 9795)  Complications: No complications documented.

## 2020-02-10 NOTE — Anesthesia Procedure Notes (Signed)
Spinal  Patient location during procedure: OR Start time: 02/10/2020 5:51 PM End time: 02/10/2020 5:54 PM Staffing Performed: anesthesiologist  Anesthesiologist: Oleta Mouse, MD Preanesthetic Checklist Completed: patient identified, IV checked, risks and benefits discussed, surgical consent, monitors and equipment checked, pre-op evaluation and timeout performed Spinal Block Patient position: right lateral decubitus Prep: DuraPrep Patient monitoring: heart rate, cardiac monitor, continuous pulse ox and blood pressure Approach: midline Location: L3-4 Injection technique: single-shot Needle Needle type: Pencan  Needle gauge: 24 G Needle length: 9 cm Assessment Sensory level: T6

## 2020-02-10 NOTE — ED Provider Notes (Signed)
[facility] DEPT Provider Note   CSN: [id number] Arrival date & time: 02/10/20  0845     History Chief Complaint  Patient presents with  . Hip Pain    Morgan Bradley is a 59 y.o. female brought by EMS for evaluation of right hand pain. Patient reports that this morning at approximately 530-6 AM, she was walking outside and states that she tripped over a lawnmower trailer that was on the ground, causing her to fall. She reports that she fell forward, hitting her head on her car and then fell backwards, hitting her head on the ground. She does think that she had positive LOC. She is on aspirin but no other blood thinners. She also reports that she landed on her right hip. She reports that since then, she has had pain in that hip, particular when trying to move or bend it. She has not been able to put any weight or ambulate on that right lower extremity secondary to pain. Patient also having some pain in her right elbow. She denies any preceding chest pain or dizziness that caused her fall. She denies any numbness/weakness of arms or legs, abdominal pain, nausea/vomiting.  The history is provided by the patient.       Past Medical History:  Diagnosis Date  . Allergy    seasonal  . Cataract    bilateral-removed  . Hypertension     Patient Active Problem List   Diagnosis Date Noted  . Closed right hip fracture ([place]) 02/10/2020  . HTN (hypertension) 02/10/2020  . Family hx of ischem heart dis and oth dis of the circ sys 02/10/2020  . Seasonal allergies 02/10/2020  . Closed fracture of neck of right femur [facility])     Past Surgical History:  Procedure Laterality Date  . cataracts     bilateral  . COLONOSCOPY    . POLYPECTOMY    . RETINAL DETACHMENT SURGERY    . TUBAL LIGATION       OB History   No obstetric history on file.     Family History  Problem Relation Age of Onset  . Colon polyps Mother   . Colon cancer Neg Hx   . Esophageal  cancer Neg Hx   . Rectal cancer Neg Hx   . Stomach cancer Neg Hx     Social History   Tobacco Use  . Smoking status: Never Smoker  . Smokeless tobacco: Never Used  Vaping Use  . Vaping Use: Never used  Substance Use Topics  . Alcohol use: Never  . Drug use: Never    Home Medications Prior to Admission medications   Medication Sig Start Date End Date Taking? Authorizing Provider  Ascorbic Acid (VITAMIN C) 1000 MG tablet Take 1,000 mg by mouth daily.   Yes [provider]  aspirin EC 81 MG tablet Take 81 mg by mouth daily.   Yes [provider]  aspirin-acetaminophen-caffeine (EXCEDRIN MIGRAINE) [phone] MG tablet Take 1 tablet by mouth every 6 (six) hours as needed for headache.   Yes [provider]  atorvastatin (LIPITOR) 20 MG tablet Take 20 mg by mouth daily. 01/13/20  Yes [provider]  calcium carbonate (OSCAL) 1500 (600 Ca) MG TABS tablet Take 600 mg by mouth daily with breakfast.   Yes [provider]  cetirizine (ZYRTEC) 10 MG tablet Take 10 mg by mouth daily.   Yes [provider]  lisinopril (ZESTRIL) 10 MG tablet Take 10 mg by mouth daily.  02/19/19  Yes [provider]  magnesium oxide (MAG-OX) 400 MG tablet Take 400 mg by mouth daily.   Yes [provider]  Misc Natural Products (PUMPKIN SEED OIL) CAPS Take 1 capsule by mouth daily.   Yes [provider]  OSPHENA 60 MG TABS Take 60 mg by mouth daily.  01/13/19  Yes [provider]  polyvinyl alcohol (LIQUIFILM TEARS) 1.4 % ophthalmic solution Place 1 drop into both eyes as needed for dry eyes.   Yes [provider]    Allergies    Sulfa antibiotics and Ciprofloxacin  Review of Systems   Review of Systems  Constitutional: Negative for fever.  Respiratory: Negative for cough and shortness of breath.   Cardiovascular: Negative for chest pain.  Gastrointestinal: Negative for abdominal pain, nausea and vomiting.    Musculoskeletal:       Right hip pain Right elbow pain  Neurological: Negative for weakness, numbness and headaches.  All other systems reviewed and are negative.   Physical Exam Updated Vital Signs BP 113/89   Pulse 89   Temp (!) 100.4 F (38 C) (Oral)   Resp 18   Ht 5\' 5"  (1.651 m)   Wt 64.9 kg   SpO2 95%   BMI 23.80 kg/m   Physical Exam Vitals and nursing note reviewed.  Constitutional:      Appearance: Normal appearance. She is well-developed.  HENT:     Head: Normocephalic and atraumatic.     Comments: No tenderness to palpation of skull. No deformities or crepitus noted. No open wounds, abrasions or lacerations.  Eyes:     General: Lids are normal.     Conjunctiva/sclera: Conjunctivae normal.     Pupils: Pupils are equal, round, and reactive to light.     Comments: Regular pupil noted on right eye which patient states is baseline.  Neck:     Comments: Full flexion/extension and lateral movement of neck fully intact. No bony midline tenderness. No deformities or crepitus.  Cardiovascular:     Rate and Rhythm: Normal rate and regular rhythm.     Pulses: Normal pulses.          Radial pulses are 2+ on the right side and 2+ on the left side.       Dorsalis pedis pulses are 2+ on the right side and 2+ on the left side.     Heart sounds: Normal heart sounds. No murmur heard.  No friction rub. No gallop.   Pulmonary:     Effort: Pulmonary effort is normal.     Breath sounds: Normal breath sounds.     Comments: Lungs clear to auscultation bilaterally.  Symmetric chest rise.  No wheezing, rales, rhonchi. Abdominal:     Palpations: Abdomen is soft. Abdomen is not rigid.     Tenderness: There is no abdominal tenderness. There is no guarding.     Comments: Abdomen is soft, non-distended, non-tender. No rigidity, No guarding. No peritoneal signs.  Musculoskeletal:        General: Normal range of motion.     Cervical back: Full passive range of motion without pain.      Comments: Right lower extremity is held in a slightly flexed position and appears slightly shortened. Limited range of motion of right lower extremity secondary to pain. Tenderness palpation noted to the right hip and right proximal femur. No deformity or crepitus noted. No bony tenderness noted to right knee, right tib-fib, right ankle. She has scattered abrasions in the  anterior aspect of bilateral knees. No tenderness palpation of the left lower extremity. Full range of motion of left lower extremity without any difficulty. Mild bony tenderness noted to the right elbow. No deformity or crepitus noted. Flexion/tension of elbow intact by difficulty. Small overlying abrasion to the right elbow. No deep laceration. No tenderness to palpation on the left upper extremity.  Skin:    General: Skin is warm and dry.     Capillary Refill: Capillary refill takes less than 2 seconds.     Comments: Good distal cap refill.  RLE is not dusky in appearance or cool to touch.  Neurological:     Mental Status: She is alert and oriented to person, place, and time.  Psychiatric:        Speech: Speech normal.     ED Results / Procedures / Treatments   Labs (all labs ordered are listed, but only abnormal results are displayed) Labs Reviewed  CBC WITH DIFFERENTIAL/PLATELET - Abnormal; Notable for the following components:      Result Value   WBC 15.7 (*)    Neutro Abs 13.9 (*)    All other components within normal limits  SARS CORONAVIRUS 2 BY RT PCR (HOSPITAL ORDER, [facility])  BASIC METABOLIC PANEL  HIV ANTIBODY (ROUTINE TESTING W REFLEX)  CBC  CREATININE, SERUM    EKG None  Radiology DG Elbow Complete Right  Result Date: 02/10/2020 CLINICAL DATA:  Pain following fall EXAM: RIGHT ELBOW - COMPLETE 3+ VIEW COMPARISON:  None. FINDINGS: Frontal, lateral, and bilateral oblique views were obtained. There is no appreciable fracture or dislocation. No joint effusion. Joint spaces  appear normal. No erosive change. IMPRESSION: No fracture or dislocation.  No evident arthropathy. Electronically Signed   By: Lowella Grip III M.D.   On: 02/10/2020 09:51   CT Head Wo Contrast  Result Date: 02/10/2020 CLINICAL DATA:  Posttraumatic headache after fall today. Positive loss of consciousness. EXAM: CT HEAD WITHOUT CONTRAST CT CERVICAL SPINE WITHOUT CONTRAST TECHNIQUE: Multidetector CT imaging of the head and cervical spine was performed following the standard protocol without intravenous contrast. Multiplanar CT image reconstructions of the cervical spine were also generated. COMPARISON:  None. FINDINGS: CT HEAD FINDINGS Brain: No evidence of acute infarction, hemorrhage, hydrocephalus, extra-axial collection or mass lesion/mass effect. Vascular: No hyperdense vessel or unexpected calcification. Skull: Normal. Negative for fracture or focal lesion. Sinuses/Orbits: No acute finding. Other: None. CT CERVICAL SPINE FINDINGS Alignment: Minimal grade 1 anterolisthesis of C3-4 is noted secondary to posterior facet joint hypertrophy. Skull base and vertebrae: No acute fracture. No primary bone lesion or focal pathologic process. Soft tissues and spinal canal: No prevertebral fluid or swelling. No visible canal hematoma. Disc levels: Mild degenerative disc disease is noted at C5-6 with anterior and posterior osteophyte formation. Upper chest: Negative. Other: None. IMPRESSION: 1. Normal head CT. 2. Mild degenerative disc disease is noted at C5-6. No acute abnormality seen in the cervical spine. Electronically Signed   By: Marijo Conception M.D.   On: 02/10/2020 10:06   CT Cervical Spine Wo Contrast  Result Date: 02/10/2020 CLINICAL DATA:  Posttraumatic headache after fall today. Positive loss of consciousness. EXAM: CT HEAD WITHOUT CONTRAST CT CERVICAL SPINE WITHOUT CONTRAST TECHNIQUE: Multidetector CT imaging of the head and cervical spine was performed following the standard protocol without  intravenous contrast. Multiplanar CT image reconstructions of the cervical spine were also generated. COMPARISON:  None. FINDINGS: CT HEAD FINDINGS Brain: No evidence of acute  infarction, hemorrhage, hydrocephalus, extra-axial collection or mass lesion/mass effect. Vascular: No hyperdense vessel or unexpected calcification. Skull: Normal. Negative for fracture or focal lesion. Sinuses/Orbits: No acute finding. Other: None. CT CERVICAL SPINE FINDINGS Alignment: Minimal grade 1 anterolisthesis of C3-4 is noted secondary to posterior facet joint hypertrophy. Skull base and vertebrae: No acute fracture. No primary bone lesion or focal pathologic process. Soft tissues and spinal canal: No prevertebral fluid or swelling. No visible canal hematoma. Disc levels: Mild degenerative disc disease is noted at C5-6 with anterior and posterior osteophyte formation. Upper chest: Negative. Other: None. IMPRESSION: 1. Normal head CT. 2. Mild degenerative disc disease is noted at C5-6. No acute abnormality seen in the cervical spine. Electronically Signed   By: Marijo Conception M.D.   On: 02/10/2020 10:06   Chest Portable 1 View  Result Date: 02/10/2020 CLINICAL DATA:  Preop. EXAM: PORTABLE CHEST 1 VIEW COMPARISON:  None. FINDINGS: The heart size and mediastinal contours are within normal limits. Both lungs are clear. The visualized skeletal structures are unremarkable. IMPRESSION: No active disease. Electronically Signed   By: Marijo Conception M.D.   On: 02/10/2020 12:16   DG Hip Unilat W or Wo Pelvis 2-3 Views Right  Result Date: 02/10/2020 CLINICAL DATA:  Pain following fall EXAM: DG HIP (WITH OR WITHOUT PELVIS) 2-3V RIGHT COMPARISON:  None. FINDINGS: Frontal pelvis as well as frontal and lateral views obtained. There is a comminuted subcapital femoral neck fracture on the right with a degree of impaction at fracture site. No other fracture. No dislocation. There is mild symmetric narrowing of each hip joint. IMPRESSION:  Comminuted subcapital femoral neck fracture on the right with degree of impaction at the fracture site. No other fractures evident. No dislocation. Mild symmetric narrowing each hip joint. Electronically Signed   By: Lowella Grip III M.D.   On: 02/10/2020 09:50    Procedures Procedures (including critical care time)  Medications Ordered in ED Medications  HYDROcodone-acetaminophen (NORCO/VICODIN) 5-325 MG per tablet 1-2 tablet (has no administration in time range)  morphine 2 MG/ML injection 0.5 mg (has no administration in time range)  enoxaparin (LOVENOX) injection 40 mg (has no administration in time range)  ascorbic acid (VITAMIN C) tablet 1,000 mg (has no administration in time range)  aspirin-acetaminophen-caffeine (EXCEDRIN MIGRAINE) per tablet 1 tablet (has no administration in time range)  atorvastatin (LIPITOR) tablet 20 mg (has no administration in time range)  calcium carbonate (OSCAL) tablet 600 mg (has no administration in time range)  loratadine (CLARITIN) tablet 10 mg (has no administration in time range)  lisinopril (ZESTRIL) tablet 10 mg (has no administration in time range)  polyvinyl alcohol (LIQUIFILM TEARS) 1.4 % ophthalmic solution 1 drop (has no administration in time range)  ondansetron (ZOFRAN) injection 4 mg (4 mg Intravenous Given 02/10/20 0917)  fentaNYL (SUBLIMAZE) injection 100 mcg (100 mcg Intravenous Given 02/10/20 0916)  acetaminophen (TYLENOL) tablet 1,000 mg (1,000 mg Oral Given 02/10/20 0959)  morphine 4 MG/ML injection 4 mg (4 mg Intravenous Given 02/10/20 1020)    ED Course  I have reviewed the triage vital signs and the nursing notes.  Pertinent labs & imaging results that were available during my care of the patient were reviewed by me and considered in my medical decision making (see chart for details).    MDM Rules/Calculators/A&P                          59 year old female who presents  for evaluation after mechanical fall that occurred  earlier this morning.  She reports that she tripped over a lawnmower trailer, hitting her head and landing on her right side.  Since then has been unable to ambulate or bear weight on the right lower extremity secondary to pain.  She does think that she had LOC after hitting her head.  She is on aspirin.  No other blood thinners.  She denies any numbness/weakness.  On initial arrival, she is nontoxic-appearing.  Vital signs are stable.  She does have a mildly elevated temperature at 100.4.  Patient with no symptoms.  She is neurovascularly intact.  On exam, she has limited range of motion of right lower extremity secondary to pain.  There is some shortening of the right lower extremity.  Concern for dislocation versus fracture.  We will plan for imaging.  Additionally normal plan for imaging of head and neck given that she hit her head against a car and then the ground with positive LOC.  X-ray reviewed.  There is a comminuted subcapital femoral neck fracture on the right with degree of impaction at the fracture site.  Elbow x-ray negative for any acute bony abnormality.  CBC shows slight leukocytosis of 15.7.  Hemoglobin stable at 13.3.  BMP is unremarkable. COVID negative.   CT head negative for any acute bony abnormalities.  CT C-spine negative for any acute bony abnormalities.  Discussed with Dr. Griffin Basil (ortho). He is recommending hospitalist admission with plans for the OR. Patient can stay at [facility] for surgery which will be done by Dr. Ninfa Linden tomorrow. Patient should be NPO after midnight.   Discussed patient with Dr. Wyline Copas (hospitalist) who accepts patient for admission.   Portions of this note were generated with [employer]. Dictation errors may occur despite best attempts at proofreading.  Final Clinical Impression(s) / ED Diagnoses Final diagnoses:  Closed fracture of neck of right femur, initial encounter [facility])    Rx / DC Orders ED Discharge Orders    None         Desma Mcgregor 02/10/20 1226    Charlesetta Shanks, MD 02/11/20 1210

## 2020-02-10 NOTE — Discharge Instructions (Signed)

## 2020-02-10 NOTE — Anesthesia Postprocedure Evaluation (Signed)
Anesthesia Post Note  Patient: Denina Fantroy  Procedure(s) Performed: TOTAL HIP ARTHROPLASTY ANTERIOR APPROACH (Right Hip)     Patient location during evaluation: PACU Anesthesia Type: MAC and Spinal Level of consciousness: awake and alert Pain management: pain level controlled Vital Signs Assessment: post-procedure vital signs reviewed and stable Respiratory status: spontaneous breathing, nonlabored ventilation, respiratory function stable and patient connected to nasal cannula oxygen Cardiovascular status: stable and blood pressure returned to baseline Postop Assessment: no apparent nausea or vomiting and spinal receding Anesthetic complications: no   No complications documented.  Last Vitals:  Vitals:   02/10/20 2015 02/10/20 2028  BP: 126/69 121/70  Pulse: 69 66  Resp: 14 16  Temp: 36.8 C 36.9 C  SpO2: 100% 100%    Last Pain:  Vitals:   02/10/20 2045  TempSrc:   PainSc: 7                  Elizette Shek

## 2020-02-10 NOTE — Brief Op Note (Signed)
02/10/2020  7:15 PM  PATIENT:  Morgan Bradley  59 y.o. female  PRE-OPERATIVE DIAGNOSIS:  Right hip fracture  POST-OPERATIVE DIAGNOSIS:  right hip fracture  PROCEDURE:  Procedure(s): TOTAL HIP ARTHROPLASTY ANTERIOR APPROACH (Right)  SURGEON:  Surgeon(s) and Role:    Mcarthur Rossetti, MD - Primary  PHYSICIAN ASSISTANT: Benita Stabile, PA-C  ANESTHESIA:   spinal  EBL:  300 mL   COUNTS:  YES  DICTATION: .Other Dictation: Dictation Number [id number]  PLAN OF CARE: Admit to inpatient   PATIENT DISPOSITION:  PACU - hemodynamically stable.   Delay start of Pharmacological VTE agent (>24hrs) due to surgical blood loss or risk of bleeding: no

## 2020-02-11 ENCOUNTER — Encounter (HOSPITAL_COMMUNITY): Payer: Self-pay | Admitting: Orthopaedic Surgery

## 2020-02-11 LAB — BASIC METABOLIC PANEL
Anion gap: 7 (ref 5–15)
BUN: 10 mg/dL (ref 6–20)
CO2: 26 mmol/L (ref 22–32)
Calcium: 8.1 mg/dL — ABNORMAL LOW (ref 8.9–10.3)
Chloride: 105 mmol/L (ref 98–111)
Creatinine, Ser: 0.66 mg/dL (ref 0.44–1.00)
GFR calc Af Amer: >60 mL/min (ref >60)
GFR calc non Af Amer: >60 mL/min (ref >60)
Glucose, Bld: 127 mg/dL — ABNORMAL HIGH (ref 70–99)
Potassium: 4 mmol/L (ref 3.5–5.1)
Sodium: 138 mmol/L (ref 135–145)

## 2020-02-11 LAB — CBC
HCT: 31 % — ABNORMAL LOW (ref 36.0–46.0)
Hemoglobin: 10 g/dL — ABNORMAL LOW (ref 12.0–15.0)
MCH: 29.7 pg (ref 26.0–34.0)
MCHC: 32.3 g/dL (ref 30.0–36.0)
MCV: 92 fL (ref 80.0–100.0)
Platelets: 171 10*3/uL (ref 150–400)
RBC: 3.37 MIL/uL — ABNORMAL LOW (ref 3.87–5.11)
RDW: 13.3 % (ref 11.5–15.5)
WBC: 12.2 10*3/uL — ABNORMAL HIGH (ref 4.0–10.5)
nRBC: 0 % (ref 0.0–0.2)

## 2020-02-11 LAB — HIV ANTIBODY (ROUTINE TESTING W REFLEX): HIV Screen 4th Generation wRfx: NONREACTIVE

## 2020-02-11 MED ORDER — ACETAMINOPHEN 500 MG PO TABS
1000.0000 mg | ORAL_TABLET | Freq: Three times a day (TID) | ORAL | Status: DC
  Administered 2020-02-11 – 2020-02-12 (×4): 1000 mg via ORAL
  Filled 2020-02-11 (×4): qty 2

## 2020-02-11 MED ORDER — POLYETHYLENE GLYCOL 3350 17 G PO PACK
17.0000 g | PACK | Freq: Every day | ORAL | Status: DC
  Administered 2020-02-11 – 2020-02-12 (×2): 17 g via ORAL
  Filled 2020-02-11 (×2): qty 1

## 2020-02-11 NOTE — Progress Notes (Signed)
02/11/20 1400  PT Visit Information  Last PT Received On 02/11/20  Pt feeling much better after pain meds. Amb~ 40', mild dizziness  possibly d/t meds and first time OOB. Chair to pt for seated rest.  Pt is motivated and wants to go back to work next week. (She is an occupational Doctor, general practice at [employer]). continue PT POC  Assistance Needed +1  History of Present Illness 59 y.o. female with medical history significant of seasonal allergies, HTN presents to ED after mechanical fall the morning of admit. xray = femoral neck fracture is comminuted and displaced. pt is now s/p R DA THA per Dr. Ninfa Linden on 02/10/20  Subjective Data  Patient Stated Goal less pain, back to Independent  Precautions  Precautions Fall  Restrictions  Other Position/Activity Restrictions WBAT  Pain Assessment  Pain Assessment 0-10  Pain Score 3  Pain Location right hip  Pain Descriptors / Indicators Grimacing;Sore;Guarding  Pain Intervention(s) Limited activity within patient's tolerance;Monitored during session;Premedicated before session;Repositioned;Ice applied  Cognition  Arousal/Alertness Awake/alert  Behavior During Therapy WFL for tasks assessed/performed  Overall Cognitive Status Within Functional Limits for tasks assessed  Bed Mobility  Overal bed mobility Needs Assistance  Bed Mobility Supine to Sit  Supine to sit Min assist  General bed mobility comments assist with RLE, incr time   Transfers  Overall transfer level Needs assistance  Transfers Sit to/from Stand;Stand Pivot Transfers  Sit to Stand Min assist  Stand pivot transfers Min assist  General transfer comment assist to rise and transition to RW, cues for hand placement RLE management   Ambulation/Gait  Ambulation/Gait assistance Min assist  Gait Distance (Feet) 40 Feet  Assistive device Rolling walker (2 wheeled)  Gait Pattern/deviations Step-to pattern;Decreased stance time - left;Decreased step length - right;Decreased step  length - left  General Gait Details intermittent assist to advance RLE, cues for sequence,  RW position and for incr wt shift to RLE as tolerated  Balance  Overall balance assessment Needs assistance;History of Falls  Sitting-balance support Single extremity supported;Feet supported  Sitting balance-Leahy Scale Fair  Sitting balance - Comments unable to wt shift d/t pain  Postural control Left lateral lean (d/t R hip pain)  Standing balance support Bilateral upper extremity supported;Single extremity supported;During functional activity  Standing balance-Leahy Scale Poor  Standing balance comment reliant on at least unilateral UE support   PT - End of Session  Equipment Utilized During Treatment Gait belt  Activity Tolerance Patient tolerated treatment well  Patient left in chair;with call bell/phone within reach;with family/visitor present  Nurse Communication Mobility status   PT - Assessment/Plan  PT Plan Current plan remains appropriate  PT Visit Diagnosis Difficulty in walking, not elsewhere classified (R26.2);Other abnormalities of gait and mobility (R26.89);Pain  Pain - Right/Left Right  Pain - part of body Hip  PT Frequency (ACUTE ONLY) 7X/week  Follow Up Recommendations Home health PT  PT equipment None recommended by PT  AM-PAC PT "6 Clicks" Mobility Outcome Measure (Version 2)  Help needed turning from your back to your side while in a flat bed without using bedrails? 2  Help needed moving from lying on your back to sitting on the side of a flat bed without using bedrails? 3  Help needed moving to and from a bed to a chair (including a wheelchair)? 3  Help needed standing up from a chair using your arms (e.g., wheelchair or bedside chair)? 3  Help needed to walk in hospital room? 3  Help  needed climbing 3-5 steps with a railing?  2  6 Click Score 16  Consider Recommendation of Discharge To: Home with [facility]  PT Goal Progression  Progress towards PT goals Progressing toward  goals  Acute Rehab PT Goals  PT Goal Formulation With patient  Time For Goal Achievement 02/18/20  Potential to Achieve Goals Good  PT Time Calculation  PT Start Time (ACUTE ONLY) 1412  PT Stop Time (ACUTE ONLY) 1447  PT Time Calculation (min) (ACUTE ONLY) 35 min  PT General Charges  $$ ACUTE PT VISIT 1 Visit  PT Treatments  $Gait Training 8-22 mins  $Therapeutic Activity 8-22 mins

## 2020-02-11 NOTE — Evaluation (Signed)
Physical Therapy Evaluation Patient Details Name: Morgan Bradley MRN: [id number] DOB: 03-Feb-1961 Today's Date: 02/11/2020   History of Present Illness  59 y.o. female with medical history significant of seasonal allergies, HTN presents to ED after mechanical fall the morning of admit. xray = femoral neck fracture is comminuted and displaced. pt is now s/p R DA THA per Dr. Ninfa Linden on 02/10/20  Clinical Impression  Pt is s/p DA THA secondary to traumatic fall/hip fx. resulting in the deficits listed below (see PT Problem List).  Pt seen for bed level eval at this time. 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 Will attempt to see pt again as schedule permits (after meds, RN at bedside, giving meds on PT departure) Pt will benefit from skilled PT to increase their independence and safety with mobility to allow discharge to the venue listed below.      Follow Up Recommendations Home health PT    Equipment Recommendations  None recommended by PT    Recommendations for Other Services       Precautions / Restrictions Precautions Precautions: Fall Restrictions Weight Bearing Restrictions: No Other Position/Activity Restrictions: WBAT      Mobility  Bed Mobility               General bed mobility comments: unable d/t incr pain with movement  Transfers                    Ambulation/Gait                Stairs            Wheelchair Mobility    Modified Rankin (Stroke Patients Only)       Balance Overall balance assessment: Needs assistance     Sitting balance - Comments: unable at this time d/t pain       Standing balance comment: TBA                             Pertinent Vitals/Pain Pain Assessment: 0-10 Pain Score: 7  Pain Location: right hip Pain Descriptors / Indicators:  Grimacing;Sore;Aching;Guarding Pain Intervention(s): Limited activity within patient's tolerance;Monitored during session;Premedicated before session;Repositioned    Home Living Family/patient expects to be discharged to:: Private residence Living Arrangements: Spouse/significant other Available Help at Discharge: Family Type of Home: House Home Access: Stairs to enter   Technical brewer of Steps: 1 Home Layout: Two level;Able to live on main level with bedroom/bathroom Home Equipment: [place] - single point;Crutches;Walker - 2 wheels;Bedside commode Additional Comments: has pull out sofa down stairs    Prior Function                 Hand Dominance        Extremity/Trunk Assessment   Upper Extremity Assessment Upper Extremity Assessment: Overall WFL for tasks assessed    Lower Extremity Assessment Lower Extremity Assessment: RLE deficits/detail RLE Deficits / Details: ankle WFL, knee and hip limited P/AROM d/t post op pain       Communication   Communication: No difficulties  Cognition Arousal/Alertness: Awake/alert Behavior During Therapy: WFL for tasks assessed/performed Overall Cognitive Status: Within Functional Limits for tasks assessed  General Comments      Exercises Total Joint Exercises Ankle Circles/Pumps: AROM;Both;5 reps Quad Sets: AROM;Both;Limitations Quad Sets Limitations: pain Heel Slides: PROM;AAROM;Limitations Heel Slides Limitations: pain   Assessment/Plan    PT Assessment Patient needs continued PT services  PT Problem List Decreased strength;Decreased mobility;Decreased range of motion;Decreased activity tolerance;Decreased balance;Pain       PT Treatment Interventions DME instruction;Therapeutic exercise;Gait training;Functional mobility training;Therapeutic activities;Patient/family education;Stair training    PT Goals (Current goals can be found in the Care Plan  section)  Acute Rehab PT Goals Patient Stated Goal: less pain, back to Independent PT Goal Formulation: With patient Time For Goal Achievement: 02/18/20 Potential to Achieve Goals: Good    Frequency 7X/week   Barriers to discharge        Co-evaluation               AM-PAC PT "6 Clicks" Mobility  Outcome Measure Help needed turning from your back to your side while in a flat bed without using bedrails?: A Lot Help needed moving from lying on your back to sitting on the side of a flat bed without using bedrails?: A Lot Help needed moving to and from a bed to a chair (including a wheelchair)?: A Lot Help needed standing up from a chair using your arms (e.g., wheelchair or bedside chair)?: Total Help needed to walk in hospital room?: Total Help needed climbing 3-5 steps with a railing? : Total 6 Click Score: 9    End of Session   Activity Tolerance: Patient limited by pain Patient left: in bed;with call bell/phone within reach   PT Visit Diagnosis: Difficulty in walking, not elsewhere classified (R26.2);Other abnormalities of gait and mobility (R26.89);Pain Pain - Right/Left: Right Pain - part of body: Hip    Time: 1251-1307 PT Time Calculation (min) (ACUTE ONLY): 16 min   Charges:   PT Evaluation $PT Eval Low Complexity: 1 Low          Lamontae Ricardo, PT  Acute Rehab Dept ([facility]) [phone] Pager [phone]  02/11/2020   [facility] 02/11/2020, 1:37 PM

## 2020-02-11 NOTE — Plan of Care (Signed)
  Problem: Pain Managment: Goal: General experience of comfort will improve Outcome: Progressing   Problem: Elimination: Goal: Will not experience complications related to bowel motility Outcome: Progressing   Problem: Elimination: Goal: Will not experience complications related to urinary retention Outcome: Progressing   Problem: Safety: Goal: Ability to remain free from injury will improve Outcome: Progressing   Problem: Coping: Goal: Level of anxiety will decrease Outcome: Progressing

## 2020-02-11 NOTE — Progress Notes (Signed)
PROGRESS NOTE    Morgan Bradley  OZD:[id number] DOB: 22-May-1961 DOA: 02/10/2020 PCP: Nicoletta Dress, MD   Chief Complaint  Patient presents with  . Hip Pain   Brief Narrative: Morgan Bradley is Morgan Bradley 59 y.o. female with medical history significant of seasonal allergies, HTN presents to ED after mechanical fall the morning of admit. Around 5AM, pt was walking down driveway when she tripped over part of lawnmower that blended into surrounding. This resulted in pt falling onto R side with resulting pain. Patient was able to walk up flight of stairs, attempting to get ready for work. Ultimately, pt decided to go to ED for further work up. Denies LOC, sob, chest pain   ED Course: In the ED, pt found to have comminuted subcapital femoral neck fracture on the R with degree of impaction at the fracture site. Orthopedic Surgery consulted. Hospitalist consulted for consideration for admission.  Assessment & Plan:   Principal Problem:   Closed right hip fracture (HCC) Active Problems:   HTN (hypertension)   Family hx of ischem heart dis and oth dis of the circ sys   Seasonal allergies  1. R hip fracture 1. S/p mechanical fall on the morning of admit, tripping on lawnmower equipment 2. Xray personally reviewed, findings of comminuted femoral neck fracture on R 3. S/p R total hip arthroplasty  4. ASA BID for DVT ppx 5. PT/OT 6. Scheduled APAP, prn oxy, bowel regimen   2. HTN 1. BP stable at present 2. Continue lisinopril per home med  3. Seasonal allergies 1. Stable 2. Cont antihistamine per home regimen  4. Family hx of heart disease 1. Denies chest pains  DVT prophylaxis: ASA BID, SCD Code Status: full  Family Communication: husband at bedside Disposition:   Status is: Inpatient  Remains inpatient appropriate because:Inpatient level of care appropriate due to severity of illness   Dispo: The patient is from: Home              Anticipated d/c is to: Home               Anticipated d/c date is: 1 day              Patient currently is not medically stable to d/c.  Consultants:   orthopedics  Procedures:  S/p R total hip arthroplasty  7/15  Antimicrobials:  Anti-infectives (From admission, onward)   Start     Dose/Rate Route Frequency Ordered Stop   02/10/20 2359  ceFAZolin (ANCEF) IVPB 1 g/50 mL premix        1 g 100 mL/hr over 30 Minutes Intravenous Every 6 hours 02/10/20 2027 02/11/20 0612   02/10/20 1700  ceFAZolin (ANCEF) IVPB 2g/100 mL premix        2 g 200 mL/hr over 30 Minutes Intravenous On call to O.R. 02/10/20 1502 02/10/20 1807     Subjective: C/o post op pain  Objective: Vitals:   02/11/20 0541 02/11/20 0925 02/11/20 1515 02/11/20 1521  BP: 120/69 119/67 (!) 102/55   Pulse: 90 80 (!) 101   Resp: 16 18 18    Temp: 98.7 F (37.1 C) 98.7 F (37.1 C) 98.7 F (37.1 C)   TempSrc: Oral Oral Oral   SpO2: 99% 97% 99% 91%  Weight:      Height:        Intake/Output Summary (Last 24 hours) at 02/11/2020 1729 Last data filed at 02/11/2020 1500 Gross per 24 hour  Intake 4085.03 ml  Output 2450 ml  Net 1635.03 ml   Filed Weights   02/10/20 0901 02/10/20 0906  Weight: 64.9 kg 64.9 kg    Examination:  General exam: Appears calm and comfortable  Respiratory system: Clear to auscultation. Respiratory effort normal. Cardiovascular system: S1 & S2 heard, RRR. Gastrointestinal system: Abdomen is nondistended, soft and nontender.  Central nervous system: Alert and oriented. No focal neurological deficits. Extremities: RLE with intact dressing Skin: No rashes, lesions or ulcers Psychiatry: Judgement and insight appear normal. Mood & affect appropriate.     Data Reviewed: I have personally reviewed following labs and imaging studies  CBC: Recent Labs  Lab 02/10/20 0902 02/11/20 0328  WBC 15.7* 12.2*  NEUTROABS 13.9*  --   HGB 13.3 10.0*  HCT 40.4 31.0*  MCV 91.0 92.0  PLT 244 867    Basic Metabolic Panel: Recent Labs    Lab 02/10/20 0902 02/11/20 0328  NA 143 138  K 3.7 4.0  CL 103 105  CO2 28 26  GLUCOSE 99 127*  BUN 15 10  CREATININE 0.82 0.66  CALCIUM 9.9 8.1*    GFR: Estimated Creatinine Clearance: 69.8 mL/min (by C-G formula based on SCr of 0.66 mg/dL).  Liver Function Tests: No results for input(s): AST, ALT, ALKPHOS, BILITOT, PROT, ALBUMIN in the last 168 hours.  CBG: No results for input(s): GLUCAP in the last 168 hours.   Recent Results (from the past 240 hour(s))  SARS Coronavirus 2 by RT PCR (hospital order, performed in [facility] hospital lab) Nasopharyngeal Nasopharyngeal Swab     Status: None   Collection Time: 02/10/20 10:00 AM   Specimen: Nasopharyngeal Swab  Result Value Ref Range Status   SARS Coronavirus 2 NEGATIVE NEGATIVE Final    Comment: (NOTE) SARS-CoV-2 target nucleic acids are NOT DETECTED.  The SARS-CoV-2 RNA is generally detectable in upper and lower respiratory specimens during the acute phase of infection. The lowest concentration of SARS-CoV-2 viral copies this assay can detect is 250 copies / mL. Morgan Bradley negative result does not preclude SARS-CoV-2 infection and should not be used as the sole basis for treatment or other patient management decisions.  Morgan Bradley negative result may occur with improper specimen collection / handling, submission of specimen other than nasopharyngeal swab, presence of viral mutation(s) within the areas targeted by this assay, and inadequate number of viral copies (<250 copies / mL). Morgan Bradley negative result must be combined with clinical observations, patient history, and epidemiological information.  Fact Sheet for Patients:   StrictlyIdeas.no  Fact Sheet for Healthcare Providers: BankingDealers.co.za  This test is not yet approved or  cleared by the [place] FDA and has been authorized for detection and/or diagnosis of SARS-CoV-2 by FDA under an Emergency Use Authorization (EUA).   This EUA will remain in effect (meaning this test can be used) for the duration of the COVID-19 declaration under Section 564(b)(1) of the Act, 21 U.S.C. section 360bbb-3(b)(1), unless the authorization is terminated or revoked sooner.  Performed at [facility], [place] [address] [place], [place] [address]   Surgical pcr screen     Status: None   Collection Time: 02/10/20  4:15 PM   Specimen: Nasal Mucosa; Nasal Swab  Result Value Ref Range Status   MRSA, PCR NEGATIVE NEGATIVE Final   Staphylococcus aureus NEGATIVE NEGATIVE Final    Comment: (NOTE) The Xpert SA Assay (FDA approved for NASAL specimens in patients 56 years of age and older), is one component of Nyzir Dubois comprehensive surveillance program. It is not intended to  diagnose infection nor to guide or monitor treatment. Performed at [facility], [place] [address], [place], [place] [address]          Radiology Studies: DG Elbow Complete Right  Result Date: 02/10/2020 CLINICAL DATA:  Pain following fall EXAM: RIGHT ELBOW - COMPLETE 3+ VIEW COMPARISON:  None. FINDINGS: Frontal, lateral, and bilateral oblique views were obtained. There is no appreciable fracture or dislocation. No joint effusion. Joint spaces appear normal. No erosive change. IMPRESSION: No fracture or dislocation.  No evident arthropathy. Electronically Signed   By: Lowella Grip III M.D.   On: 02/10/2020 09:51   CT Head Wo Contrast  Result Date: 02/10/2020 CLINICAL DATA:  Posttraumatic headache after fall today. Positive loss of consciousness. EXAM: CT HEAD WITHOUT CONTRAST CT CERVICAL SPINE WITHOUT CONTRAST TECHNIQUE: Multidetector CT imaging of the head and cervical spine was performed following the standard protocol without intravenous contrast. Multiplanar CT image reconstructions of the cervical spine were also generated. COMPARISON:  None. FINDINGS: CT HEAD FINDINGS Brain: No evidence of acute infarction, hemorrhage,  hydrocephalus, extra-axial collection or mass lesion/mass effect. Vascular: No hyperdense vessel or unexpected calcification. Skull: Normal. Negative for fracture or focal lesion. Sinuses/Orbits: No acute finding. Other: None. CT CERVICAL SPINE FINDINGS Alignment: Minimal grade 1 anterolisthesis of C3-4 is noted secondary to posterior facet joint hypertrophy. Skull base and vertebrae: No acute fracture. No primary bone lesion or focal pathologic process. Soft tissues and spinal canal: No prevertebral fluid or swelling. No visible canal hematoma. Disc levels: Mild degenerative disc disease is noted at C5-6 with anterior and posterior osteophyte formation. Upper chest: Negative. Other: None. IMPRESSION: 1. Normal head CT. 2. Mild degenerative disc disease is noted at C5-6. No acute abnormality seen in the cervical spine. Electronically Signed   By: Marijo Conception M.D.   On: 02/10/2020 10:06   CT Cervical Spine Wo Contrast  Result Date: 02/10/2020 CLINICAL DATA:  Posttraumatic headache after fall today. Positive loss of consciousness. EXAM: CT HEAD WITHOUT CONTRAST CT CERVICAL SPINE WITHOUT CONTRAST TECHNIQUE: Multidetector CT imaging of the head and cervical spine was performed following the standard protocol without intravenous contrast. Multiplanar CT image reconstructions of the cervical spine were also generated. COMPARISON:  None. FINDINGS: CT HEAD FINDINGS Brain: No evidence of acute infarction, hemorrhage, hydrocephalus, extra-axial collection or mass lesion/mass effect. Vascular: No hyperdense vessel or unexpected calcification. Skull: Normal. Negative for fracture or focal lesion. Sinuses/Orbits: No acute finding. Other: None. CT CERVICAL SPINE FINDINGS Alignment: Minimal grade 1 anterolisthesis of C3-4 is noted secondary to posterior facet joint hypertrophy. Skull base and vertebrae: No acute fracture. No primary bone lesion or focal pathologic process. Soft tissues and spinal canal: No prevertebral  fluid or swelling. No visible canal hematoma. Disc levels: Mild degenerative disc disease is noted at C5-6 with anterior and posterior osteophyte formation. Upper chest: Negative. Other: None. IMPRESSION: 1. Normal head CT. 2. Mild degenerative disc disease is noted at C5-6. No acute abnormality seen in the cervical spine. Electronically Signed   By: Marijo Conception M.D.   On: 02/10/2020 10:06   DG Pelvis Portable  Result Date: 02/10/2020 CLINICAL DATA:  Right hip fracture, intraoperative examination. EXAM: PORTABLE PELVIS 1-2 VIEWS; OPERATIVE RIGHT HIP WITH PELVIS COMPARISON:  9:36 Abbey Veith.m. FINDINGS: Three fluoroscopic intraoperative radiographs of the reported right hip demonstrates surgical changes of right total hip arthroplasty. Arthroplasty components are in anatomic alignment. No unexpected fracture or dislocation. FLUOROSCOPY TIME:  0.5 minutes IMPRESSION: Intraoperative radiographs as described above.  Electronically Signed   By: Fidela Salisbury MD   On: 02/10/2020 20:46   Chest Portable 1 View  Result Date: 02/10/2020 CLINICAL DATA:  Preop. EXAM: PORTABLE CHEST 1 VIEW COMPARISON:  None. FINDINGS: The heart size and mediastinal contours are within normal limits. Both lungs are clear. The visualized skeletal structures are unremarkable. IMPRESSION: No active disease. Electronically Signed   By: Marijo Conception M.D.   On: 02/10/2020 12:16   DG C-Arm 1-60 Min-No Report  Result Date: 02/10/2020 Fluoroscopy was utilized by the requesting physician.  No radiographic interpretation.   DG HIP OPERATIVE UNILAT WITH PELVIS RIGHT  Result Date: 02/10/2020 CLINICAL DATA:  Right hip fracture, intraoperative examination. EXAM: PORTABLE PELVIS 1-2 VIEWS; OPERATIVE RIGHT HIP WITH PELVIS COMPARISON:  9:36 Byrant Valent.m. FINDINGS: Three fluoroscopic intraoperative radiographs of the reported right hip demonstrates surgical changes of right total hip arthroplasty. Arthroplasty components are in anatomic alignment. No  unexpected fracture or dislocation. FLUOROSCOPY TIME:  0.5 minutes IMPRESSION: Intraoperative radiographs as described above. Electronically Signed   By: Fidela Salisbury MD   On: 02/10/2020 20:46   DG Hip Unilat W or Wo Pelvis 2-3 Views Right  Result Date: 02/10/2020 CLINICAL DATA:  Pain following fall EXAM: DG HIP (WITH OR WITHOUT PELVIS) 2-3V RIGHT COMPARISON:  None. FINDINGS: Frontal pelvis as well as frontal and lateral views obtained. There is Deniesha Stenglein comminuted subcapital femoral neck fracture on the right with Thinh Cuccaro degree of impaction at fracture site. No other fracture. No dislocation. There is mild symmetric narrowing of each hip joint. IMPRESSION: Comminuted subcapital femoral neck fracture on the right with degree of impaction at the fracture site. No other fractures evident. No dislocation. Mild symmetric narrowing each hip joint. Electronically Signed   By: Lowella Grip III M.D.   On: 02/10/2020 09:50        Scheduled Meds: . vitamin C  1,000 mg Oral Daily  . aspirin  81 mg Oral BID  . atorvastatin  20 mg Oral Daily  . calcium carbonate  1 tablet Oral Q breakfast  . docusate sodium  100 mg Oral BID  . enoxaparin (LOVENOX) injection  40 mg Subcutaneous Q24H  . gabapentin  100 mg Oral TID  . lisinopril  10 mg Oral Daily  . loratadine  10 mg Oral Daily  . pantoprazole  40 mg Oral Daily   Continuous Infusions: . sodium chloride Stopped (02/11/20 1211)  . lactated ringers 50 mL/hr at 02/10/20 1733  . methocarbamol (ROBAXIN) IV       LOS: 1 day    Time spent: over 30 min    Fayrene Helper, MD [facility]   To contact the attending provider between 7A-7P or the covering provider during after hours 7P-7A, please log into the web site [email or web address] and access using universal [place] password for that web site. If you do not have the password, please call the hospital operator.  02/11/2020, 5:29 PM

## 2020-02-12 LAB — CBC WITH DIFFERENTIAL/PLATELET
Abs Immature Granulocytes: 0.04 10*3/uL (ref 0.00–0.07)
Basophils Absolute: 0 10*3/uL (ref 0.0–0.1)
Basophils Relative: 0 %
Eosinophils Absolute: 0.1 10*3/uL (ref 0.0–0.5)
Eosinophils Relative: 1 %
HCT: 28.5 % — ABNORMAL LOW (ref 36.0–46.0)
Hemoglobin: 8.9 g/dL — ABNORMAL LOW (ref 12.0–15.0)
Immature Granulocytes: 0 %
Lymphocytes Relative: 22 %
Lymphs Abs: 2.2 10*3/uL (ref 0.7–4.0)
MCH: 29.1 pg (ref 26.0–34.0)
MCHC: 31.2 g/dL (ref 30.0–36.0)
MCV: 93.1 fL (ref 80.0–100.0)
Monocytes Absolute: 1 10*3/uL (ref 0.1–1.0)
Monocytes Relative: 10 %
Neutro Abs: 6.6 10*3/uL (ref 1.7–7.7)
Neutrophils Relative %: 67 %
Platelets: 135 10*3/uL — ABNORMAL LOW (ref 150–400)
RBC: 3.06 MIL/uL — ABNORMAL LOW (ref 3.87–5.11)
RDW: 13.3 % (ref 11.5–15.5)
WBC: 9.9 10*3/uL (ref 4.0–10.5)
nRBC: 0 % (ref 0.0–0.2)

## 2020-02-12 LAB — COMPREHENSIVE METABOLIC PANEL
ALT: 15 U/L (ref 0–44)
AST: 17 U/L (ref 15–41)
Albumin: 3.1 g/dL — ABNORMAL LOW (ref 3.5–5.0)
Alkaline Phosphatase: 46 U/L (ref 38–126)
Anion gap: 6 (ref 5–15)
BUN: 10 mg/dL (ref 6–20)
CO2: 28 mmol/L (ref 22–32)
Calcium: 8.3 mg/dL — ABNORMAL LOW (ref 8.9–10.3)
Chloride: 103 mmol/L (ref 98–111)
Creatinine, Ser: 0.68 mg/dL (ref 0.44–1.00)
GFR calc Af Amer: >60 mL/min (ref >60)
GFR calc non Af Amer: >60 mL/min (ref >60)
Glucose, Bld: 109 mg/dL — ABNORMAL HIGH (ref 70–99)
Potassium: 4.2 mmol/L (ref 3.5–5.1)
Sodium: 137 mmol/L (ref 135–145)
Total Bilirubin: 0.6 mg/dL (ref 0.3–1.2)
Total Protein: 5.3 g/dL — ABNORMAL LOW (ref 6.5–8.1)

## 2020-02-12 LAB — PHOSPHORUS: Phosphorus: 2.8 mg/dL (ref 2.5–4.6)

## 2020-02-12 LAB — MAGNESIUM: Magnesium: 2 mg/dL (ref 1.7–2.4)

## 2020-02-12 MED ORDER — ASPIRIN 81 MG PO CHEW: 81.0000 mg | CHEWABLE_TABLET | Freq: Two times a day (BID) | ORAL | 0 refills | Status: AC

## 2020-02-12 MED ORDER — METHOCARBAMOL 500 MG PO TABS: 500.0000 mg | ORAL_TABLET | Freq: Four times a day (QID) | ORAL | 1 refills | Status: AC | PRN

## 2020-02-12 MED ORDER — OXYCODONE HCL 5 MG PO TABS: 5.0000 mg | ORAL_TABLET | ORAL | 0 refills | Status: DC | PRN

## 2020-02-12 NOTE — Progress Notes (Signed)
Subjective: 2 Days Post-Op Procedure(s) (LRB): TOTAL HIP ARTHROPLASTY ANTERIOR APPROACH (Right) Patient reports pain as moderate.  Working with therapy.  Doing well overall.  Acute blood loss anemia from fracture and surgery, but tolerating well.  Objective: Vital signs in last 24 hours: Temp:  [98.7 F (37.1 C)-100.3 F (37.9 C)] 100.3 F (37.9 C) (07/17 0550) Pulse Rate:  [88-101] 88 (07/17 0550) Resp:  [14-18] 14 (07/17 0550) BP: (102-115)/(55-67) 115/59 (07/17 0550) SpO2:  [91 %-99 %] 98 % (07/17 0550)  Intake/Output from previous day: 07/16 0701 - 07/17 0700 In: 1537.6 [P.O.:1100; I.V.:437.6] Out: 2000 [Urine:2000] Intake/Output this shift: No intake/output data recorded.  Recent Labs    02/10/20 0902 02/11/20 0328 02/12/20 0331  HGB 13.3 10.0* 8.9*   Recent Labs    02/11/20 0328 02/12/20 0331  WBC 12.2* 9.9  RBC 3.37* 3.06*  HCT 31.0* 28.5*  PLT 171 135*   Recent Labs    02/11/20 0328 02/12/20 0331  NA 138 137  K 4.0 4.2  CL 105 103  CO2 26 28  BUN 10 10  CREATININE 0.66 0.68  GLUCOSE 127* 109*  CALCIUM 8.1* 8.3*   No results for input(s): LABPT, INR in the last 72 hours.  Sensation intact distally Intact pulses distally Dorsiflexion/Plantar flexion intact Incision: scant drainage   Assessment/Plan: 2 Days Post-Op Procedure(s) (LRB): TOTAL HIP ARTHROPLASTY ANTERIOR APPROACH (Right) Up with therapy Discharge home with home health this afternoon.      Mcarthur Rossetti 02/12/2020, 10:07 AM

## 2020-02-12 NOTE — Discharge Summary (Signed)
Patient ID: Morgan Bradley MRN: [id number] DOB/AGE: [birth year] 57 y.o.  Admit date: 02/10/2020 Discharge date: 02/12/2020  Admission Diagnoses:  Principal Problem:   Closed right hip fracture ([place]) Active Problems:   HTN (hypertension)   Family hx of ischem heart dis and oth dis of the circ sys   Seasonal allergies   Discharge Diagnoses:  Same  Past Medical History:  Diagnosis Date  . Allergy    seasonal  . Cataract    bilateral-removed  . Hypertension     Surgeries: Procedure(s): TOTAL HIP ARTHROPLASTY ANTERIOR APPROACH on 02/10/2020   Consultants: Treatment Team:  Mcarthur Rossetti, MD  Discharged Condition: Improved  Hospital Course: Morgan Bradley is an 59 y.o. female who was admitted 02/10/2020 for operative treatment ofClosed right hip fracture ([place]). Patient has severe unremitting pain that affects sleep, daily activities, and work/hobbies. After pre-op clearance the patient was taken to the operating room on 02/10/2020 and underwent  Procedure(s): TOTAL HIP ARTHROPLASTY ANTERIOR APPROACH.    Patient was given perioperative antibiotics:  Anti-infectives (From admission, onward)   Start     Dose/Rate Route Frequency Ordered Stop   02/10/20 2359  ceFAZolin (ANCEF) IVPB 1 g/50 mL premix        1 g 100 mL/hr over 30 Minutes Intravenous Every 6 hours 02/10/20 2027 02/11/20 0612   02/10/20 1700  ceFAZolin (ANCEF) IVPB 2g/100 mL premix        2 g 200 mL/hr over 30 Minutes Intravenous On call to O.R. 02/10/20 1502 02/10/20 1807       Patient was given sequential compression devices, early ambulation, and chemoprophylaxis to prevent DVT.  Patient benefited maximally from hospital stay and there were no complications.    Recent vital signs:  Patient Vitals for the past 24 hrs:  BP Temp Temp src Pulse Resp SpO2  02/12/20 0550 (!) 115/59 100.3 F (37.9 C) Oral 88 14 98 %  02/11/20 2253 102/67 100 F (37.8 C) Oral 96 16 98 %  02/11/20 1521 -- -- -- -- -- 91 %   02/11/20 1515 (!) 102/55 98.7 F (37.1 C) Oral (!) 101 18 99 %     Recent laboratory studies:  Recent Labs    02/11/20 0328 02/11/20 0328 02/12/20 0331  WBC 12.2*  --  9.9  HGB 10.0*  --  8.9*  HCT 31.0*  --  28.5*  PLT 171  --  135*  NA 138  --  137  K 4.0  --  4.2  CL 105  --  103  CO2 26  --  28  BUN 10  --  10  CREATININE 0.66  --  0.68  GLUCOSE 127*  --  109*  CALCIUM 8.1*   < > 8.3*   < > = values in this interval not displayed.     Discharge Medications:   Allergies as of 02/12/2020      Reactions   Sulfa Antibiotics Hives   Ciprofloxacin Rash      Medication List    STOP taking these medications   aspirin EC 81 MG tablet Replaced by: aspirin 81 MG chewable tablet     TAKE these medications   aspirin 81 MG chewable tablet Chew 1 tablet (81 mg total) by mouth 2 (two) times daily. Replaces: aspirin EC 81 MG tablet   aspirin-acetaminophen-caffeine 250-250-65 MG tablet Commonly known as: EXCEDRIN MIGRAINE Take 1 tablet by mouth every 6 (six) hours as needed for headache.   atorvastatin 20 MG tablet Commonly  known as: LIPITOR Take 20 mg by mouth daily.   calcium carbonate 1500 (600 Ca) MG Tabs tablet Commonly known as: OSCAL Take 600 mg by mouth daily with breakfast.   cetirizine 10 MG tablet Commonly known as: ZYRTEC Take 10 mg by mouth daily.   lisinopril 10 MG tablet Commonly known as: ZESTRIL Take 10 mg by mouth daily.   magnesium oxide 400 MG tablet Commonly known as: MAG-OX Take 400 mg by mouth daily.   methocarbamol 500 MG tablet Commonly known as: ROBAXIN Take 1 tablet (500 mg total) by mouth every 6 (six) hours as needed for muscle spasms.   Osphena 60 MG Tabs Generic drug: Ospemifene Take 60 mg by mouth daily.   oxyCODONE 5 MG immediate release tablet Commonly known as: Oxy IR/ROXICODONE Take 1-2 tablets (5-10 mg total) by mouth every 4 (four) hours as needed for moderate pain (pain score 4-6).   polyvinyl alcohol 1.4 %  ophthalmic solution Commonly known as: LIQUIFILM TEARS Place 1 drop into both eyes as needed for dry eyes.   Pumpkin Seed Oil Caps Take 1 capsule by mouth daily.   vitamin C 1000 MG tablet Take 1,000 mg by mouth daily.            Durable Medical Equipment  (From admission, onward)         Start     Ordered   02/10/20 2028  DME 3 n 1  Once        02/10/20 2027   02/10/20 2028  DME Walker rolling  Once       Question Answer Comment  Walker: With 5 Inch Wheels   Patient needs a walker to treat with the following condition Status post total replacement of right hip      02/10/20 2027          Diagnostic Studies: DG Elbow Complete Right  Result Date: 02/10/2020 CLINICAL DATA:  Pain following fall EXAM: RIGHT ELBOW - COMPLETE 3+ VIEW COMPARISON:  None. FINDINGS: Frontal, lateral, and bilateral oblique views were obtained. There is no appreciable fracture or dislocation. No joint effusion. Joint spaces appear normal. No erosive change. IMPRESSION: No fracture or dislocation.  No evident arthropathy. Electronically Signed   By: Lowella Grip III M.D.   On: 02/10/2020 09:51   CT Head Wo Contrast  Result Date: 02/10/2020 CLINICAL DATA:  Posttraumatic headache after fall today. Positive loss of consciousness. EXAM: CT HEAD WITHOUT CONTRAST CT CERVICAL SPINE WITHOUT CONTRAST TECHNIQUE: Multidetector CT imaging of the head and cervical spine was performed following the standard protocol without intravenous contrast. Multiplanar CT image reconstructions of the cervical spine were also generated. COMPARISON:  None. FINDINGS: CT HEAD FINDINGS Brain: No evidence of acute infarction, hemorrhage, hydrocephalus, extra-axial collection or mass lesion/mass effect. Vascular: No hyperdense vessel or unexpected calcification. Skull: Normal. Negative for fracture or focal lesion. Sinuses/Orbits: No acute finding. Other: None. CT CERVICAL SPINE FINDINGS Alignment: Minimal grade 1 anterolisthesis of  C3-4 is noted secondary to posterior facet joint hypertrophy. Skull base and vertebrae: No acute fracture. No primary bone lesion or focal pathologic process. Soft tissues and spinal canal: No prevertebral fluid or swelling. No visible canal hematoma. Disc levels: Mild degenerative disc disease is noted at C5-6 with anterior and posterior osteophyte formation. Upper chest: Negative. Other: None. IMPRESSION: 1. Normal head CT. 2. Mild degenerative disc disease is noted at C5-6. No acute abnormality seen in the cervical spine. Electronically Signed   By: Bobbe Medico.D.  On: 02/10/2020 10:06   CT Cervical Spine Wo Contrast  Result Date: 02/10/2020 CLINICAL DATA:  Posttraumatic headache after fall today. Positive loss of consciousness. EXAM: CT HEAD WITHOUT CONTRAST CT CERVICAL SPINE WITHOUT CONTRAST TECHNIQUE: Multidetector CT imaging of the head and cervical spine was performed following the standard protocol without intravenous contrast. Multiplanar CT image reconstructions of the cervical spine were also generated. COMPARISON:  None. FINDINGS: CT HEAD FINDINGS Brain: No evidence of acute infarction, hemorrhage, hydrocephalus, extra-axial collection or mass lesion/mass effect. Vascular: No hyperdense vessel or unexpected calcification. Skull: Normal. Negative for fracture or focal lesion. Sinuses/Orbits: No acute finding. Other: None. CT CERVICAL SPINE FINDINGS Alignment: Minimal grade 1 anterolisthesis of C3-4 is noted secondary to posterior facet joint hypertrophy. Skull base and vertebrae: No acute fracture. No primary bone lesion or focal pathologic process. Soft tissues and spinal canal: No prevertebral fluid or swelling. No visible canal hematoma. Disc levels: Mild degenerative disc disease is noted at C5-6 with anterior and posterior osteophyte formation. Upper chest: Negative. Other: None. IMPRESSION: 1. Normal head CT. 2. Mild degenerative disc disease is noted at C5-6. No acute abnormality seen  in the cervical spine. Electronically Signed   By: Marijo Conception M.D.   On: 02/10/2020 10:06   DG Pelvis Portable  Result Date: 02/10/2020 CLINICAL DATA:  Right hip fracture, intraoperative examination. EXAM: PORTABLE PELVIS 1-2 VIEWS; OPERATIVE RIGHT HIP WITH PELVIS COMPARISON:  9:36 a.m. FINDINGS: Three fluoroscopic intraoperative radiographs of the reported right hip demonstrates surgical changes of right total hip arthroplasty. Arthroplasty components are in anatomic alignment. No unexpected fracture or dislocation. FLUOROSCOPY TIME:  0.5 minutes IMPRESSION: Intraoperative radiographs as described above. Electronically Signed   By: Fidela Salisbury MD   On: 02/10/2020 20:46   Chest Portable 1 View  Result Date: 02/10/2020 CLINICAL DATA:  Preop. EXAM: PORTABLE CHEST 1 VIEW COMPARISON:  None. FINDINGS: The heart size and mediastinal contours are within normal limits. Both lungs are clear. The visualized skeletal structures are unremarkable. IMPRESSION: No active disease. Electronically Signed   By: Marijo Conception M.D.   On: 02/10/2020 12:16   DG C-Arm 1-60 Min-No Report  Result Date: 02/10/2020 Fluoroscopy was utilized by the requesting physician.  No radiographic interpretation.   DG HIP OPERATIVE UNILAT WITH PELVIS RIGHT  Result Date: 02/10/2020 CLINICAL DATA:  Right hip fracture, intraoperative examination. EXAM: PORTABLE PELVIS 1-2 VIEWS; OPERATIVE RIGHT HIP WITH PELVIS COMPARISON:  9:36 a.m. FINDINGS: Three fluoroscopic intraoperative radiographs of the reported right hip demonstrates surgical changes of right total hip arthroplasty. Arthroplasty components are in anatomic alignment. No unexpected fracture or dislocation. FLUOROSCOPY TIME:  0.5 minutes IMPRESSION: Intraoperative radiographs as described above. Electronically Signed   By: Fidela Salisbury MD   On: 02/10/2020 20:46   DG Hip Unilat W or Wo Pelvis 2-3 Views Right  Result Date: 02/10/2020 CLINICAL DATA:  Pain following fall  EXAM: DG HIP (WITH OR WITHOUT PELVIS) 2-3V RIGHT COMPARISON:  None. FINDINGS: Frontal pelvis as well as frontal and lateral views obtained. There is a comminuted subcapital femoral neck fracture on the right with a degree of impaction at fracture site. No other fracture. No dislocation. There is mild symmetric narrowing of each hip joint. IMPRESSION: Comminuted subcapital femoral neck fracture on the right with degree of impaction at the fracture site. No other fractures evident. No dislocation. Mild symmetric narrowing each hip joint. Electronically Signed   By: Lowella Grip III M.D.   On: 02/10/2020 09:50    Disposition:  Discharge disposition: 01-Home or Self Care          Follow-up Information    Mcarthur Rossetti, MD. Schedule an appointment as soon as possible for a visit in 2 week(s).   Specialty: Orthopedic Surgery Contact information: [address] [place] [address] [phone]                Signed: Mcarthur Rossetti 02/12/2020, 10:10 AM

## 2020-02-12 NOTE — Progress Notes (Signed)
Physical Therapy Treatment Patient Details Name: Morgan Bradley MRN: [id number] DOB: [birth year] Today's Date: 02/12/2020    History of Present Illness 59 y.o. female with medical history significant of seasonal allergies, HTN presents to ED after mechanical fall the morning of admit. xray = femoral neck fracture is comminuted and displaced. pt is now s/p R DA THA per Dr. Ninfa Linden on 02/10/20    PT Comments    Pt reporting far less pain today vs yesterday, ambulating great hallway distance with use of RW. PT encouraged heel-toe gait as pt favoring RLE significantly, performed well and will continue to progress to reciprocal gait. Pt tolerated THA exercises well, will see pt for second session.    Follow Up Recommendations  Supervision for mobility/OOB     Equipment Recommendations  None recommended by PT    Recommendations for Other Services       Precautions / Restrictions Precautions Precautions: Fall Restrictions Weight Bearing Restrictions: No Other Position/Activity Restrictions: WBAT    Mobility  Bed Mobility Overal bed mobility: Needs Assistance             General bed mobility comments: up in chair upon PT arrival to room, requests stay in chair upon PT exit.  Transfers Overall transfer level: Needs assistance Equipment used: Rolling walker (2 wheeled) Transfers: Sit to/from Stand Sit to Stand: Supervision         General transfer comment: for safety, verbal cuing for hand placement when rising/sitting.  Ambulation/Gait Ambulation/Gait assistance: Supervision;Min guard Gait Distance (Feet): 300 Feet Assistive device: Rolling walker (2 wheeled) Gait Pattern/deviations: Step-to pattern;Decreased step length - right;Decreased step length - left;Decreased stance time - right;Antalgic Gait velocity: decr   General Gait Details: Min guard initially, transitioning to supervision for safety. VCs for heel-toe gait, upright posture, and positioning in  RW.   Stairs Stairs: Yes Stairs assistance: Min guard Stair Management: No rails;Forwards;With walker Number of Stairs: 1 General stair comments: min guard for safety, with husband stabilizing RW. Verbal cuing for sequencing. VERY painful when ascending   Wheelchair Mobility    Modified Rankin (Stroke Patients Only)       Balance Overall balance assessment: Needs assistance;History of Falls Sitting-balance support: Single extremity supported;Feet supported Sitting balance-Leahy Scale: Good   Postural control:  (d/t R hip pain) Standing balance support: Bilateral upper extremity supported;During functional activity Standing balance-Leahy Scale: Poor                              Cognition Arousal/Alertness: Awake/alert Behavior During Therapy: WFL for tasks assessed/performed Overall Cognitive Status: Within Functional Limits for tasks assessed                                        Exercises Total Joint Exercises Ankle Circles/Pumps: AROM;Both;10 reps;Seated Quad Sets: AROM;Right;10 reps;Seated Heel Slides: AAROM;Right;10 reps;Seated Hip ABduction/ADduction: AAROM;Right;10 reps;Seated Long Arc Quad: AROM;Right;10 reps;Seated    General Comments        Pertinent Vitals/Pain Pain Assessment: Faces Faces Pain Scale: Hurts a little bit Pain Location: right hip Pain Descriptors / Indicators: Burning Pain Intervention(s): Limited activity within patient's tolerance;Monitored during session;Repositioned    Home Living                      Prior Function            PT  Goals (current goals can now be found in the care plan section) Acute Rehab PT Goals Patient Stated Goal: less pain, back to Independent PT Goal Formulation: With patient Time For Goal Achievement: 02/18/20 Potential to Achieve Goals: Good Progress towards PT goals: Progressing toward goals    Frequency    7X/week      PT Plan Current plan remains  appropriate    Co-evaluation              AM-PAC PT "6 Clicks" Mobility   Outcome Measure  Help needed turning from your back to your side while in a flat bed without using bedrails?: A Little Help needed moving from lying on your back to sitting on the side of a flat bed without using bedrails?: A Little Help needed moving to and from a bed to a chair (including a wheelchair)?: A Little Help needed standing up from a chair using your arms (e.g., wheelchair or bedside chair)?: A Little Help needed to walk in hospital room?: A Little Help needed climbing 3-5 steps with a railing? : A Lot 6 Click Score: 17    End of Session   Activity Tolerance: Patient tolerated treatment well Patient left: in chair;with call bell/phone within reach;with family/visitor present Nurse Communication: Mobility status PT Visit Diagnosis: Difficulty in walking, not elsewhere classified (R26.2);Other abnormalities of gait and mobility (R26.89);Pain Pain - Right/Left: Right Pain - part of body: Hip     Time: 1005-1025 PT Time Calculation (min) (ACUTE ONLY): 20 min  Charges:  $Gait Training: 8-22 mins                    [name] E, [place] Pager [phone]  Office [phone]   [name] 02/12/2020, 11:44 AM

## 2020-02-12 NOTE — TOC Initial Note (Signed)
Transition of Care [facility]) - Initial/Assessment Note    Patient Details  Name: Morgan Bradley MRN: [id number] Date of Birth: 1961/03/25  Transition of Care (TOC) CM/SW Contact:    Joaquin Courts, RN Phone Number: 02/12/2020, 11:05 AM  Clinical Narrative:                 CM spoke with patient at bedside.  Patient reports she is very active and does not feel that she needs HH or OPPT. Patient reports she has spoken with her surgeon who is in agreement that she can go home without [facility] services or OPPT.  CM requested PT to provide patient with a list of home exercises. Patient reports she has all necessary dme at home.  Expected Discharge Plan: Home/Self Care Barriers to Discharge: No Barriers Identified   Patient Goals and CMS Choice Patient states their goals for this hospitalization and ongoing recovery are:: to go home today      Expected Discharge Plan and Services Expected Discharge Plan: Home/Self Care   Discharge Planning Services: CM Consult   Living arrangements for the past 2 months: Single Family Home Expected Discharge Date: 02/12/20               DME Arranged: N/A DME Agency: NA       HH Arranged: NA [place] Agency: NA        Prior Living Arrangements/Services Living arrangements for the past 2 months: Single Family Home Lives with:: Spouse Patient language and need for interpreter reviewed:: Yes Do you feel safe going back to the place where you live?: Yes      Need for Family Participation in Patient Care: Yes (Comment) Care giver support system in place?: Yes (comment)   Criminal Activity/Legal Involvement Pertinent to Current Situation/Hospitalization: No - Comment as needed  Activities of Daily Living Home Assistive Devices/Equipment: Eyeglasses ADL Screening (condition at time of admission) Patient's cognitive ability adequate to safely complete daily activities?: Yes Is the patient deaf or have difficulty hearing?: No Does the patient have  difficulty seeing, even when wearing glasses/contacts?: No Does the patient have difficulty concentrating, remembering, or making decisions?: No Patient able to express need for assistance with ADLs?: Yes Does the patient have difficulty dressing or bathing?: Yes Independently performs ADLs?: No Communication: Independent Dressing (OT): Needs assistance Is this a change from baseline?: Change from baseline, expected to last >3 days Grooming: Needs assistance Is this a change from baseline?: Change from baseline, expected to last >3 days Feeding: Needs assistance Is this a change from baseline?: Change from baseline, expected to last >3 days Bathing: Needs assistance Is this a change from baseline?: Change from baseline, expected to last >3 days Toileting: Dependent Is this a change from baseline?: Change from baseline, expected to last >3days In/Out Bed: Dependent Is this a change from baseline?: Change from baseline, expected to last >3 days Walks in Home: Dependent Is this a change from baseline?: Change from baseline, expected to last >3 days Does the patient have difficulty walking or climbing stairs?: Yes Weakness of Legs: Right Weakness of Arms/Hands: None  Permission Sought/Granted                  Emotional Assessment Appearance:: Appears stated age Attitude/Demeanor/Rapport: Engaged Affect (typically observed): Accepting Orientation: : Oriented to Self, Oriented to Place, Oriented to  Time, Oriented to Situation   Psych Involvement: No (comment)  Admission diagnosis:  Preop examination [Z01.818] Closed right hip fracture ([place]) [S72.001A] Closed fracture of  neck of right femur, initial encounter [facility]) [S72.001A] Patient Active Problem List   Diagnosis Date Noted  . Closed right hip fracture ([place]) 02/10/2020  . HTN (hypertension) 02/10/2020  . Family hx of ischem heart dis and oth dis of the circ sys 02/10/2020  . Seasonal allergies 02/10/2020  . Closed  fracture of neck of right femur ([place])    PCP:  Nicoletta Dress, MD Pharmacy:   [facility], [place] [address] Phone: [phone] Fax: [facility] [place] [address] [place] [address] Phone: [phone] Fax: [phone]     Social Determinants of Health (SDOH) Interventions    Readmission Risk Interventions No flowsheet data found.

## 2020-02-12 NOTE — Progress Notes (Signed)
Physical Therapy Treatment Patient Details Name: Morgan Bradley MRN: [id number] DOB: [birth year] Today's Date: 02/12/2020    History of Present Illness 59 y.o. female with medical history significant of seasonal allergies, HTN presents to ED after mechanical fall the morning of admit. xray = femoral neck fracture is comminuted and displaced. pt is now s/p R DA THA per Dr. Ninfa Linden on 02/10/20    PT Comments    Focus of final PT session of gait progression and stair navigation, both of which pt performed proficiently. Pt with anxiety with RLE WB during step navigation, PT encouraged husband to be with pt at all times during steps, guard and hold RW as needed. PT also suggested doing flight of steps x1 a day only to start, given pt's anxiety and fatigue, vs sleeping in living room downstairs until pt becomes more confident. PT reviewed and practice THA standing exercises with pt, and HEP reviewed. No further questions from pt and family, pt to d/c home today.    Follow Up Recommendations  Supervision for mobility/OOB     Equipment Recommendations  None recommended by PT    Recommendations for Other Services       Precautions / Restrictions Precautions Precautions: Fall Restrictions Weight Bearing Restrictions: No Other Position/Activity Restrictions: WBAT    Mobility  Bed Mobility Overal bed mobility: Needs Assistance Bed Mobility: Supine to Sit     Supine to sit: Min assist     General bed mobility comments: min assist for RLE lifting and translation to EOB, per pt and husband pt's husband can assist with this at home.  Transfers Overall transfer level: Needs assistance Equipment used: Rolling walker (2 wheeled) Transfers: Sit to/from Stand Sit to Stand: Supervision         General transfer comment: for safety, verbal cuing for hand placement when rising/sitting. STS x3  Ambulation/Gait Ambulation/Gait assistance: Supervision;Min guard Gait Distance (Feet): 250  Feet Assistive device: Rolling walker (2 wheeled) Gait Pattern/deviations: Decreased stance time - right;Antalgic;Step-through pattern;Decreased stride length;Trunk flexed Gait velocity: decr   General Gait Details: Supervision for safety, occasional min guard. Verbal cuing for upright posture, pt progressing to step-over-step gait this day.   Stairs Stairs: Yes Stairs assistance: Min guard Stair Management: Forwards;With walker;One rail Right;No rails;Step to pattern;Backwards;With crutches Number of Stairs: 3 General stair comments: Pt performed x2 step training with no rails with use of backward method and RW, cuing for sequencing (up with good, down with bad leg leading), step-to pattern, and caregiver stabilizing RW along cross bar as well as standing below pt on steps. 2x3 steps practiced with R rail and single axillary crutch to imitate stair set up to pt bedroom, min guard for safety with verbal cuing for sequncing with crutch, taking time for safety.   Wheelchair Mobility    Modified Rankin (Stroke Patients Only)       Balance Overall balance assessment: Needs assistance;History of Falls Sitting-balance support: Single extremity supported;Feet supported Sitting balance-Leahy Scale: Good     Standing balance support: Bilateral upper extremity supported;During functional activity Standing balance-Leahy Scale: Poor                              Cognition Arousal/Alertness: Awake/alert Behavior During Therapy: WFL for tasks assessed/performed Overall Cognitive Status: Within Functional Limits for tasks assessed  Exercises Total Joint Exercises Hip ABduction/ADduction: AROM;Right;10 reps;Standing Knee Flexion: AROM;Right;10 reps;Standing Marching in Standing: AROM;Right;10 reps;Standing    General Comments        Pertinent Vitals/Pain Pain Assessment: Faces Faces Pain Scale: Hurts a little  bit Pain Location: right hip Pain Descriptors / Indicators: Burning Pain Intervention(s): Limited activity within patient's tolerance;Monitored during session;Repositioned;RN gave pain meds during session    Home Living                      Prior Function            PT Goals (current goals can now be found in the care plan section) Acute Rehab PT Goals Patient Stated Goal: less pain, back to Independent PT Goal Formulation: With patient Time For Goal Achievement: 02/18/20 Potential to Achieve Goals: Good Progress towards PT goals: Progressing toward goals    Frequency    7X/week      PT Plan Current plan remains appropriate    Co-evaluation              AM-PAC PT "6 Clicks" Mobility   Outcome Measure  Help needed turning from your back to your side while in a flat bed without using bedrails?: A Little Help needed moving from lying on your back to sitting on the side of a flat bed without using bedrails?: A Little Help needed moving to and from a bed to a chair (including a wheelchair)?: A Little Help needed standing up from a chair using your arms (e.g., wheelchair or bedside chair)?: A Little Help needed to walk in hospital room?: A Little Help needed climbing 3-5 steps with a railing? : A Lot 6 Click Score: 17    End of Session Equipment Utilized During Treatment: Gait belt Activity Tolerance: Patient tolerated treatment well Patient left: in chair;with call bell/phone within reach;with family/visitor present Nurse Communication: Mobility status PT Visit Diagnosis: Difficulty in walking, not elsewhere classified (R26.2);Other abnormalities of gait and mobility (R26.89);Pain Pain - Right/Left: Right Pain - part of body: Hip     Time: 1350-1420 PT Time Calculation (min) (ACUTE ONLY): 30 min  Charges:  $Gait Training: 23-37 mins                     Raechel Marcos E, [place] Pager [phone]  Office [phone]    Shawniece Oyola D  Elonda Husky 02/12/2020, 3:43 PM

## 2020-02-12 NOTE — Progress Notes (Signed)
Patient discharged to home w/ husband. Given all belongings, instructions. Verbalized understanding of instructions. Escorted to pov via w/c. 

## 2020-02-13 NOTE — Progress Notes (Signed)
PROGRESS NOTE    Morgan Bradley  WGN:[id number] DOB: Apr 10, 1961 DOA: 02/10/2020 PCP: Nicoletta Dress, MD   Chief Complaint  Patient presents with   Hip Pain   Brief Narrative: Morgan Bradley is Morgan Bradley 59 y.o. female with medical history significant of seasonal allergies, HTN presents to ED after mechanical fall the morning of admit. Around 5AM, pt was walking down driveway when she tripped over part of lawnmower that blended into surrounding. This resulted in pt falling onto R side with resulting pain. Patient was able to walk up flight of stairs, attempting to get ready for work. Ultimately, pt decided to go to ED for further work up. Denies LOC, sob, chest pain   ED Course: In the ED, pt found to have comminuted subcapital femoral neck fracture on the R with degree of impaction at the fracture site. Orthopedic Surgery consulted. Hospitalist consulted for consideration for admission.  Assessment & Plan:   Principal Problem:   Closed right hip fracture (HCC) Active Problems:   HTN (hypertension)   Family hx of ischem heart dis and oth dis of the circ sys   Seasonal allergies  1. R hip fracture 1. S/p mechanical fall on the morning of admit, tripping on lawnmower equipment 2. Xray personally reviewed, findings of comminuted femoral neck fracture on R 3. S/p R total hip arthroplasty  4. ASA BID for DVT ppx 5. PT/OT 6. Scheduled APAP, prn oxy, bowel regimen  7. Discharged per ortho today 8. Discussed need to follow up osteoporosis management with PCP/ortho  2. HTN 1. BP stable at present 2. Continue lisinopril per home med  3. Seasonal allergies 1. Stable 2. Cont antihistamine per home regimen  4. Family hx of heart disease 1. Denies chest pains  DVT prophylaxis: ASA BID, SCD Code Status: full  Family Communication: husband at bedside Disposition:   Status is: Inpatient  Remains inpatient appropriate because:Inpatient level of care appropriate due to severity of  illness   Dispo: The patient is from: Home              Anticipated d/c is to: Home              Anticipated d/c date is: 1 day              Patient currently is not medically stable to d/c.  Consultants:   orthopedics  Procedures:  S/p R total hip arthroplasty  7/15  Antimicrobials:  Anti-infectives (From admission, onward)   Start     Dose/Rate Route Frequency Ordered Stop   02/10/20 2359  ceFAZolin (ANCEF) IVPB 1 g/50 mL premix        1 g 100 mL/hr over 30 Minutes Intravenous Every 6 hours 02/10/20 2027 02/11/20 0612   02/10/20 1700  ceFAZolin (ANCEF) IVPB 2g/100 mL premix        2 g 200 mL/hr over 30 Minutes Intravenous On call to O.R. 02/10/20 1502 02/10/20 1807     Subjective: Eager to go home No new complaints  Objective: Vitals:   02/11/20 2253 02/12/20 0550 02/12/20 1507 02/12/20 1512  BP: 102/67 (!) 115/59 96/63   Pulse: 96 88 (!) 109 94  Resp: 16 14    Temp: 100 F (37.8 C) 100.3 F (37.9 C) 98.7 F (37.1 C)   TempSrc: Oral Oral Oral   SpO2: 98% 98% 98%   Weight:      Height:       No intake or output data in the 24 hours ending  02/13/20 1900 Filed Weights   02/10/20 0901 02/10/20 0906  Weight: 64.9 kg 64.9 kg    Examination:  General: No acute distress. Cardiovascular: Heart sounds show Morgan Bradley regular rate, and rhythm Lungs: Clear to auscultation bilaterally Abdomen: Soft, nontender, nondistended Neurological: Alert and oriented 3. Moves all extremities 4. Cranial nerves II through XII grossly intact. Skin: Warm and dry. No rashes or lesions. Extremities: RLE with intact dressing       Data Reviewed: I have personally reviewed following labs and imaging studies  CBC: Recent Labs  Lab 02/10/20 0902 02/11/20 0328 02/12/20 0331  WBC 15.7* 12.2* 9.9  NEUTROABS 13.9*  --  6.6  HGB 13.3 10.0* 8.9*  HCT 40.4 31.0* 28.5*  MCV 91.0 92.0 93.1  PLT 244 171 135*    Basic Metabolic Panel: Recent Labs  Lab 02/10/20 0902 02/11/20 0328  02/12/20 0331  NA 143 138 137  K 3.7 4.0 4.2  CL 103 105 103  CO2 28 26 28   GLUCOSE 99 127* 109*  BUN 15 10 10   CREATININE 0.82 0.66 0.68  CALCIUM 9.9 8.1* 8.3*  MG  --   --  2.0  PHOS  --   --  2.8    GFR: Estimated Creatinine Clearance: 69.8 mL/min (by C-G formula based on SCr of 0.68 mg/dL).  Liver Function Tests: Recent Labs  Lab 02/12/20 0331  AST 17  ALT 15  ALKPHOS 46  BILITOT 0.6  PROT 5.3*  ALBUMIN 3.1*    CBG: No results for input(s): GLUCAP in the last 168 hours.   Recent Results (from the past 240 hour(s))  SARS Coronavirus 2 by RT PCR (hospital order, performed in [facility] hospital lab) Nasopharyngeal Nasopharyngeal Swab     Status: None   Collection Time: 02/10/20 10:00 AM   Specimen: Nasopharyngeal Swab  Result Value Ref Range Status   SARS Coronavirus 2 NEGATIVE NEGATIVE Final    Comment: (NOTE) SARS-CoV-2 target nucleic acids are NOT DETECTED.  The SARS-CoV-2 RNA is generally detectable in upper and lower respiratory specimens during the acute phase of infection. The lowest concentration of SARS-CoV-2 viral copies this assay can detect is 250 copies / mL. Morgan Bradley negative result does not preclude SARS-CoV-2 infection and should not be used as the sole basis for treatment or other patient management decisions.  Morgan Bradley negative result may occur with improper specimen collection / handling, submission of specimen other than nasopharyngeal swab, presence of viral mutation(s) within the areas targeted by this assay, and inadequate number of viral copies (<250 copies / mL). Morgan Bradley negative result must be combined with clinical observations, patient history, and epidemiological information.  Fact Sheet for Patients:   StrictlyIdeas.no  Fact Sheet for Healthcare Providers: BankingDealers.co.za  This test is not yet approved or  cleared by the [place] FDA and has been authorized for detection and/or diagnosis  of SARS-CoV-2 by FDA under an Emergency Use Authorization (EUA).  This EUA will remain in effect (meaning this test can be used) for the duration of the COVID-19 declaration under Section 564(b)(1) of the Act, 21 U.S.C. section 360bbb-3(b)(1), unless the authorization is terminated or revoked sooner.  Performed at [facility], [place] [address] [place], [place] [address]   Surgical pcr screen     Status: None   Collection Time: 02/10/20  4:15 PM   Specimen: Nasal Mucosa; Nasal Swab  Result Value Ref Range Status   MRSA, PCR NEGATIVE NEGATIVE Final   Staphylococcus aureus NEGATIVE NEGATIVE Final  Comment: (NOTE) The Xpert SA Assay (FDA approved for NASAL specimens in patients 54 years of age and older), is one component of Morgan Bradley comprehensive surveillance program. It is not intended to diagnose infection nor to guide or monitor treatment. Performed at [facility], [place] [address], [place], [place] [address]          Radiology Studies: No results found.      Scheduled Meds:  Continuous Infusions:    LOS: 2 days    Time spent: over 30 min    Fayrene Helper, MD [facility]   To contact the attending provider between 7A-7P or the covering provider during after hours 7P-7A, please log into the web site [email or web address] and access using universal [place] password for that web site. If you do not have the password, please call the hospital operator.  02/13/2020, 7:00 PM

## 2020-02-14 ENCOUNTER — Telehealth: Payer: Self-pay | Admitting: Orthopaedic Surgery

## 2020-02-14 NOTE — Telephone Encounter (Signed)
Could you please email note?

## 2020-02-14 NOTE — Telephone Encounter (Signed)
Ok for note 

## 2020-02-14 NOTE — Telephone Encounter (Signed)
Sent to email below.

## 2020-02-14 NOTE — Telephone Encounter (Signed)
Patient called. She would like a note to return back to work tomorrow. Her call back number is [phone]. She would like the note emailed to her [email or web address]

## 2020-02-14 NOTE — Telephone Encounter (Signed)
That will be fine.  The note should say that she is recovering from surgery on a fractured right hip.

## 2020-02-15 ENCOUNTER — Telehealth: Payer: Self-pay

## 2020-02-15 NOTE — Telephone Encounter (Signed)
Patient called stating that her right knee and right upper thigh are swollen.  Stated that the incision site doesn't hurt, but has had the swelling since last night and she returned to work on today, 02/15/2020.  Would like to know if this is normal?  Patient had right total hip on 02/10/2020.  CB# [phone].  Please advise.  Thank you.

## 2020-02-15 NOTE — Telephone Encounter (Signed)
Please advise 

## 2020-02-16 NOTE — Telephone Encounter (Signed)
I called patient and advised. 

## 2020-02-16 NOTE — Telephone Encounter (Signed)
Let her known that it can be normal for swelling in a thin person given her trauma, the surgery, and getting back to activities quickly.

## 2020-02-18 ENCOUNTER — Encounter: Payer: Self-pay | Admitting: Surgical

## 2020-02-18 ENCOUNTER — Ambulatory Visit (INDEPENDENT_AMBULATORY_CARE_PROVIDER_SITE_OTHER): Payer: Commercial Managed Care - PPO | Admitting: Surgical

## 2020-02-18 DIAGNOSIS — Z96641 Presence of right artificial hip joint: Secondary | ICD-10-CM

## 2020-02-18 NOTE — Progress Notes (Signed)
   Post-Op Visit Note   Patient: Morgan Bradley           Date of Birth: 02-27-1961           MRN: [id number] Visit Date: 02/18/2020 PCP: Nicoletta Dress, MD   Assessment & Plan:  Chief Complaint:  Chief Complaint  Patient presents with  . Right Hip - Wound Check   Visit Diagnoses: No diagnosis found.  Plan: Patient is a 59 year old female who presents s/p right total hip arthroplasty on 02/10/2020 by Dr. Ninfa Linden.  She states that yesterday she noticed that her incision looks to have a little bit of yellow discoloration proximally.  She took a picture of this and showed it to me and it did look a little questionable.  However today her incision appears without significant erythema or any discoloration.  No expressible drainage.  She denies any fevers, chills, drainage, malaise.  No recent increase in pain.  She has been steadily progressing well, improving every single day.  She is ambulating well with a walker which she only uses for balance.  No calf tenderness today.  Negative Homans' sign.  Postop dressing was replaced today after reviewing the incision.  Follow-up with Dr. Ninfa Linden at her scheduled appointment next Wednesday.      Follow-Up Instructions: No follow-ups on file.   Orders:  No orders of the defined types were placed in this encounter.  No orders of the defined types were placed in this encounter.   Imaging: No results found.  PMFS History: Patient Active Problem List   Diagnosis Date Noted  . Closed right hip fracture ([place]) 02/10/2020  . HTN (hypertension) 02/10/2020  . Family hx of ischem heart dis and oth dis of the circ sys 02/10/2020  . Seasonal allergies 02/10/2020  . Closed fracture of neck of right femur [facility])    Past Medical History:  Diagnosis Date  . Allergy    seasonal  . Cataract    bilateral-removed  . Hypertension     Family History  Problem Relation Age of Onset  . Colon polyps Mother   . Colon cancer Neg Hx   . Esophageal  cancer Neg Hx   . Rectal cancer Neg Hx   . Stomach cancer Neg Hx     Past Surgical History:  Procedure Laterality Date  . cataracts     bilateral  . COLONOSCOPY    . POLYPECTOMY    . RETINAL DETACHMENT SURGERY    . TOTAL HIP ARTHROPLASTY Right 02/10/2020   Procedure: TOTAL HIP ARTHROPLASTY ANTERIOR APPROACH;  Surgeon: Mcarthur Rossetti, MD;  Location: [facility];  Service: Orthopedics;  Laterality: Right;  . TUBAL LIGATION     Social History   Occupational History  . Not on file  Tobacco Use  . Smoking status: Never Smoker  . Smokeless tobacco: Never Used  Vaping Use  . Vaping Use: Never used  Substance and Sexual Activity  . Alcohol use: Never  . Drug use: Never  . Sexual activity: Not on file

## 2020-02-23 ENCOUNTER — Ambulatory Visit (INDEPENDENT_AMBULATORY_CARE_PROVIDER_SITE_OTHER): Payer: Commercial Managed Care - PPO | Admitting: Orthopaedic Surgery

## 2020-02-23 ENCOUNTER — Encounter: Payer: Self-pay | Admitting: Orthopaedic Surgery

## 2020-02-23 DIAGNOSIS — Z96641 Presence of right artificial hip joint: Secondary | ICD-10-CM

## 2020-02-23 NOTE — Progress Notes (Signed)
The patient is following up 2 weeks after having a right total hip arthroplasty to treat a displaced femoral neck fracture.  She is on 59 years old she had a hard mechanical fall.  She says she is really physically tired from using the walker and therapy in general.  She is off narcotics and has been driving.  She does report increased length and range of motion of her right hip.  On exam the staples been removed and Steri-Strips applied to her right hip.  It looks good overall.  There is a small area of the top of the incision will have her put mupirocin ointment on daily but otherwise things look good.  I tried to drain the seroma from the hip but really got no fluid from the hip.  Overall her leg lengths look good.  All question concerns were answered and addressed.  We will see her back in 4 weeks for repeat exam but no x-rays are needed.

## 2020-03-22 ENCOUNTER — Encounter: Payer: Self-pay | Admitting: Orthopaedic Surgery

## 2020-03-22 ENCOUNTER — Ambulatory Visit (INDEPENDENT_AMBULATORY_CARE_PROVIDER_SITE_OTHER): Payer: Commercial Managed Care - PPO | Admitting: Orthopaedic Surgery

## 2020-03-22 DIAGNOSIS — Z96641 Presence of right artificial hip joint: Secondary | ICD-10-CM

## 2020-03-22 NOTE — Progress Notes (Signed)
The patient is now 6 weeks tomorrow status post a right total hip arthroplasty.  This was to treat a displaced acute femoral neck fracture.  She is only 59 years old.  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  She still having a limp and some pain in the groin but overall feels like she has made great progress.  She does look better than she did at her first postoperative visit.  On exam her hip moves smoothly and fluidly.  She does walk with a slight limp but I had her lay in a supine position and her leg lengths are equal.  She will continue to increase her activities as comfort allows.  From my standpoint I do not really need to see her back for 6 months unless she is having issues.  At that visit I like a standing low AP pelvis and lateral of her right operative hip.

## 2020-03-24 ENCOUNTER — Other Ambulatory Visit: Payer: Self-pay | Admitting: Internal Medicine

## 2020-03-24 DIAGNOSIS — Z1231 Encounter for screening mammogram for malignant neoplasm of breast: Secondary | ICD-10-CM

## 2020-04-18 ENCOUNTER — Other Ambulatory Visit: Payer: Self-pay

## 2020-04-18 ENCOUNTER — Ambulatory Visit
Admission: RE | Admit: 2020-04-18 | Discharge: 2020-04-18 | Disposition: A | Discharge status: Home / Self Care | Payer: Commercial Managed Care - PPO | Source: Ambulatory Visit | Attending: Internal Medicine | Admitting: Internal Medicine

## 2020-04-18 DIAGNOSIS — Z1231 Encounter for screening mammogram for malignant neoplasm of breast: Secondary | ICD-10-CM

## 2020-05-24 ENCOUNTER — Encounter: Payer: Self-pay | Admitting: Orthopaedic Surgery

## 2020-05-24 ENCOUNTER — Other Ambulatory Visit: Payer: Self-pay

## 2020-05-24 ENCOUNTER — Ambulatory Visit: Payer: Self-pay

## 2020-05-24 ENCOUNTER — Ambulatory Visit (INDEPENDENT_AMBULATORY_CARE_PROVIDER_SITE_OTHER): Payer: Commercial Managed Care - PPO | Admitting: Orthopaedic Surgery

## 2020-05-24 DIAGNOSIS — M545 Low back pain, unspecified: Secondary | ICD-10-CM

## 2020-05-24 NOTE — Progress Notes (Signed)
Office Visit Note   Patient: Morgan Bradley           Date of Birth: 02-13-1961           MRN: [id number] Visit Date: 05/24/2020              Requested by: Nicoletta Dress, MD [place],  [place] [address] PCP: Nicoletta Dress, MD   Assessment & Plan: Visit Diagnoses:  1. Bilateral low back pain without sciatica, unspecified chronicity     Plan: I do feel that her low back pain is facet joint mediated.  I would like to send her to outpatient physical therapy to see if they can provide modalities that will help [place] feel better.  She has a regular scheduled appointment for [place] in March to review hip replacement again with new x-rays of her hip at that visit.  If there are issues before then with her spine she will let me know because her next that would be considering an MRI or sending her to Dr. Ernestina Patches for facet joint injections.  All questions and concerns were answered and addressed.  Follow-Up Instructions: Return in about 5 months (around 10/22/2020).   Orders:  Orders Placed This Encounter  Procedures  . XR Lumbar Spine 2-3 Views   No orders of the defined types were placed in this encounter.     Procedures: No procedures performed   Clinical Data: No additional findings.   Subjective: Chief Complaint  Patient presents with  . Lower Back - Pain  The patient is well-known to me.  She is an active 59 year old female who unfortunately sustained a significant mechanical fall back in July of this year and suffered a femoral neck fracture of her right hip.  We actually had to replace her right hip.  Since then she has had low back pain across the lower aspect of her lumbar spine.  There is no radicular components of this at all.  This is been getting worse over the last week.  She is not really take anything for pain.  She denies any weakness in the legs or numbness and tingling.  She said is been really hurting since her fall back in July.  She  said no other acute change in medical status.  She says the right hip replacement is doing well.  HPI  Review of Systems There is currently no listed headache, chest pain, shortness of breath, fever, chills, nausea, vomiting  Objective: Vital Signs: There were no vitals taken for this visit.  Physical Exam She is alert and oriented x3 and in no acute distress Ortho Exam Examination of her lumbar spine shows pain only in the posterior spinal elements medial and lateral at the lower lumbar spine.  There is no sciatica and no radicular components.  She has good range of motion with flexion extension of the spine but it is painful across the facet joint areas. Specialty Comments:  No specialty comments available.  Imaging: XR Lumbar Spine 2-3 Views  Result Date: 05/24/2020 2 views of lumbar spine show no acute findings.    PMFS History: Patient Active Problem List   Diagnosis Date Noted  . Closed right hip fracture ([place]) 02/10/2020  . HTN (hypertension) 02/10/2020  . Family hx of ischem heart dis and oth dis of the circ sys 02/10/2020  . Seasonal allergies 02/10/2020  . Closed fracture of neck of right femur [facility])    Past Medical History:  Diagnosis  Date  . Allergy    seasonal  . Cataract    bilateral-removed  . Hypertension     Family History  Problem Relation Age of Onset  . Colon polyps Mother   . Colon cancer Neg Hx   . Esophageal cancer Neg Hx   . Rectal cancer Neg Hx   . Stomach cancer Neg Hx     Past Surgical History:  Procedure Laterality Date  . cataracts     bilateral  . COLONOSCOPY    . POLYPECTOMY    . RETINAL DETACHMENT SURGERY    . TOTAL HIP ARTHROPLASTY Right 02/10/2020   Procedure: TOTAL HIP ARTHROPLASTY ANTERIOR APPROACH;  Surgeon: Mcarthur Rossetti, MD;  Location: [facility];  Service: Orthopedics;  Laterality: Right;  . TUBAL LIGATION     Social History   Occupational History  . Not on file  Tobacco Use  . Smoking status: Never Smoker    . Smokeless tobacco: Never Used  Vaping Use  . Vaping Use: Never used  Substance and Sexual Activity  . Alcohol use: Never  . Drug use: Never  . Sexual activity: Not on file

## 2020-06-01 ENCOUNTER — Telehealth: Payer: Self-pay | Admitting: Orthopaedic Surgery

## 2020-06-01 NOTE — Telephone Encounter (Signed)
05/24/20 ov note faxed to [facility]

## 2020-09-27 ENCOUNTER — Ambulatory Visit: Payer: Commercial Managed Care - PPO | Admitting: Orthopaedic Surgery

## 2020-10-02 ENCOUNTER — Encounter: Payer: Self-pay | Admitting: Orthopaedic Surgery

## 2020-10-02 ENCOUNTER — Ambulatory Visit: Payer: BC Managed Care – PPO | Admitting: Orthopaedic Surgery

## 2020-10-02 ENCOUNTER — Ambulatory Visit: Payer: Self-pay

## 2020-10-02 DIAGNOSIS — Z96641 Presence of right artificial hip joint: Secondary | ICD-10-CM | POA: Diagnosis not present

## 2020-10-02 NOTE — Progress Notes (Signed)
The patient is a very pleasant and active 60 year old female who unfortunately had a right total hip arthroplasty in July of this past year after mechanical fall in which she sustained a complete displaced femoral neck fracture.  She reports that she is doing well and has good motion and strength and at times does not feel like she has a hip replacement.  She walks with a normal-appearing gait.  Her leg lengths are equal.  She tolerates me easily putting her right hip through internal ex rotation without difficulty at all.  An AP pelvis and lateral right hip shows a well-seated total hip arthroplasty with no evidence of loosening.  It does appear to be showing signs of bony ingrowth.  This point follow-up can be as needed.  We had a long and thorough discussion about the things that we need to bring her back for this hip.  If there is any issues at all she knows to let us know.

## 2020-10-09 DIAGNOSIS — N952 Postmenopausal atrophic vaginitis: Secondary | ICD-10-CM | POA: Diagnosis not present

## 2020-10-09 DIAGNOSIS — Z01419 Encounter for gynecological examination (general) (routine) without abnormal findings: Secondary | ICD-10-CM | POA: Diagnosis not present

## 2020-11-15 DIAGNOSIS — R87615 Unsatisfactory cytologic smear of cervix: Secondary | ICD-10-CM | POA: Diagnosis not present

## 2021-05-17 DIAGNOSIS — H65192 Other acute nonsuppurative otitis media, left ear: Secondary | ICD-10-CM | POA: Diagnosis not present

## 2021-07-18 DIAGNOSIS — Z1331 Encounter for screening for depression: Secondary | ICD-10-CM | POA: Diagnosis not present

## 2021-07-18 DIAGNOSIS — Z0000 Encounter for general adult medical examination without abnormal findings: Secondary | ICD-10-CM | POA: Diagnosis not present

## 2021-07-24 IMAGING — MG DIGITAL SCREENING BILAT W/ TOMO W/ CAD
8 series · 8 of 24 positions shown · non-contrast
Comparison: Previous exam(s).

CLINICAL DATA: Screening.

EXAM:
DIGITAL SCREENING BILATERAL MAMMOGRAM WITH TOMO AND CAD

[L CC synth-2D]
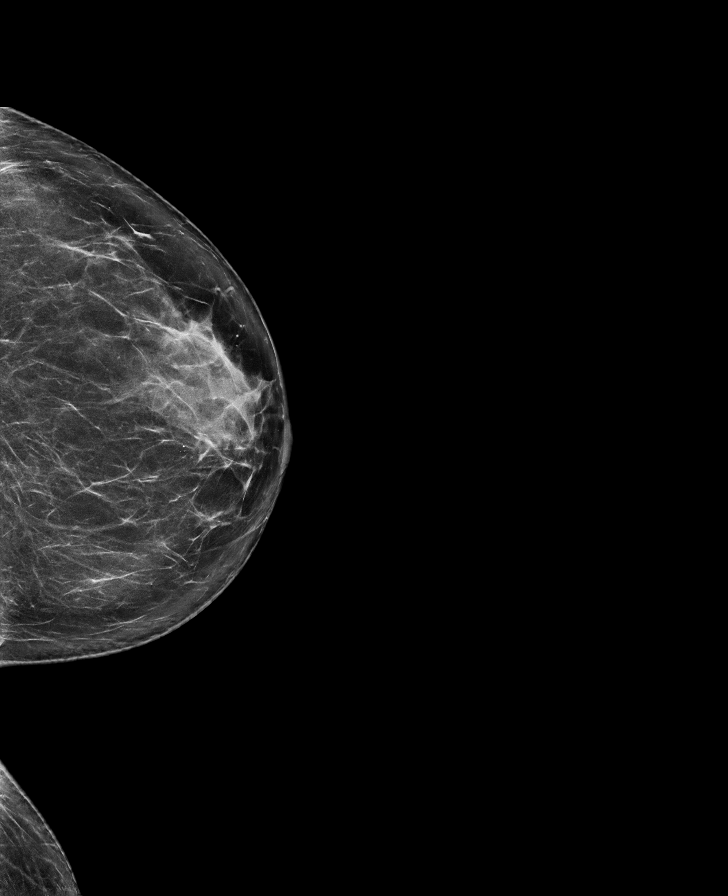

[R CC synth-2D]
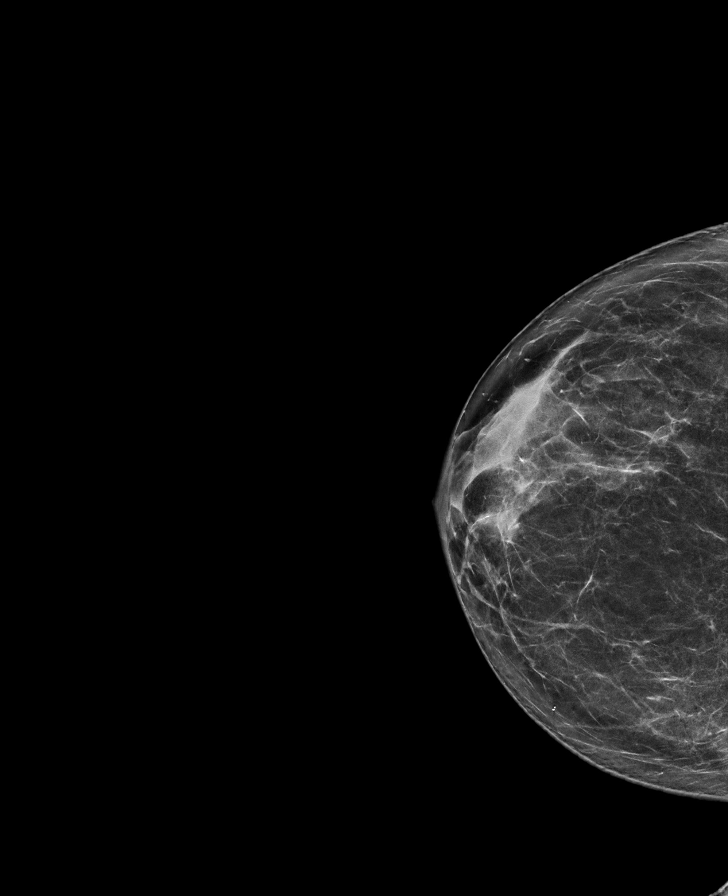

[R MLO synth-2D]
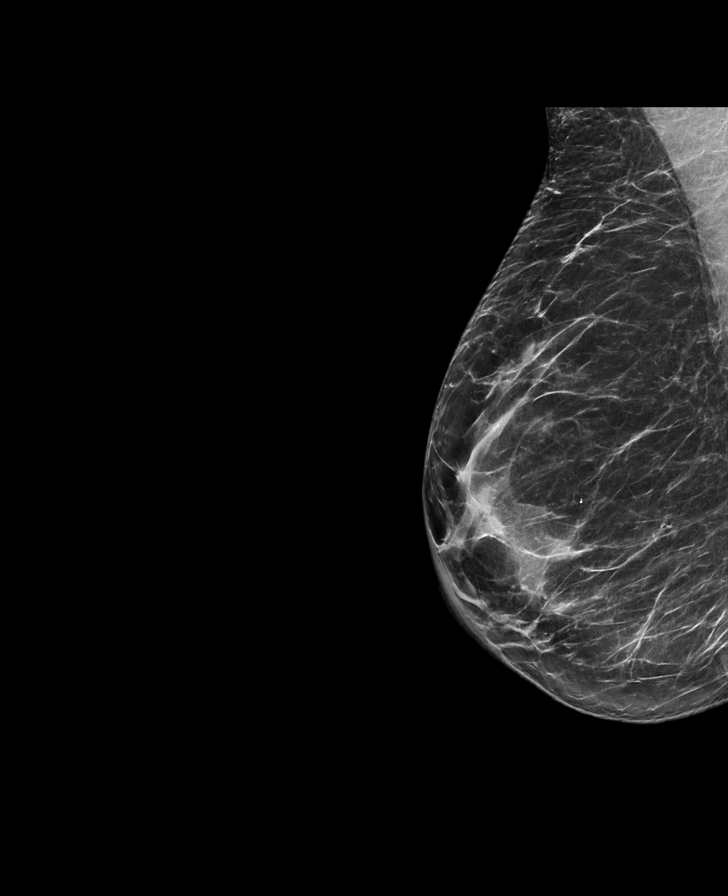

[L MLO synth-2D]
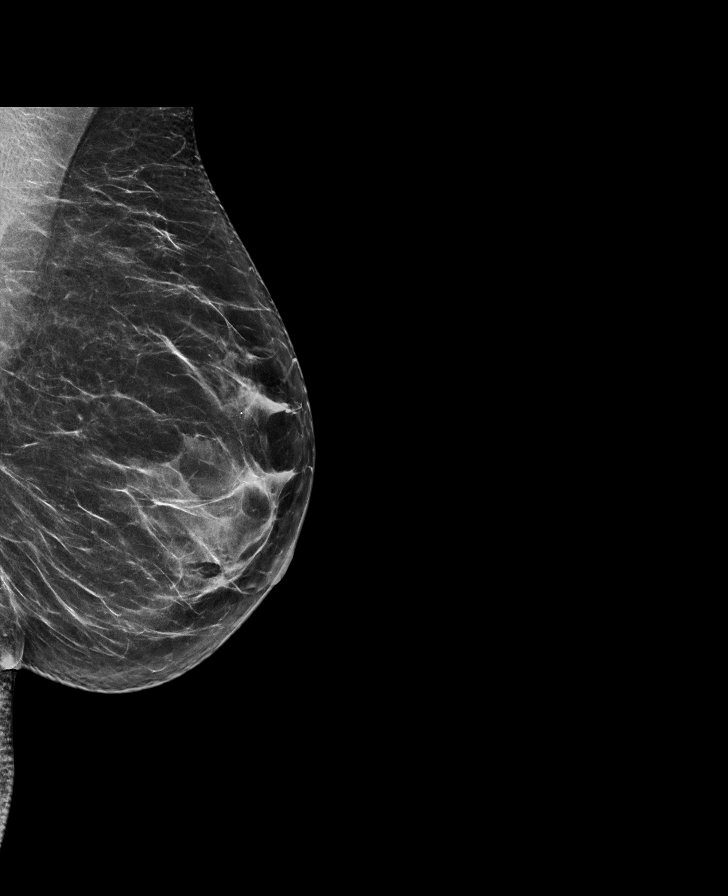

[L MLO tomo · tomo slice 38/75.0]
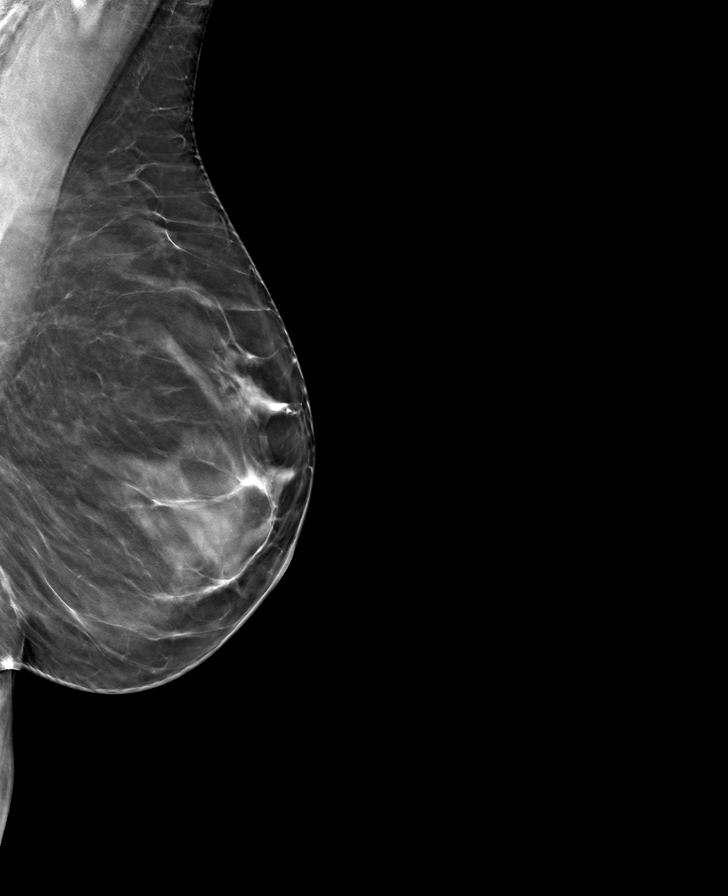

[L CC tomo · tomo slice 39/76.0]
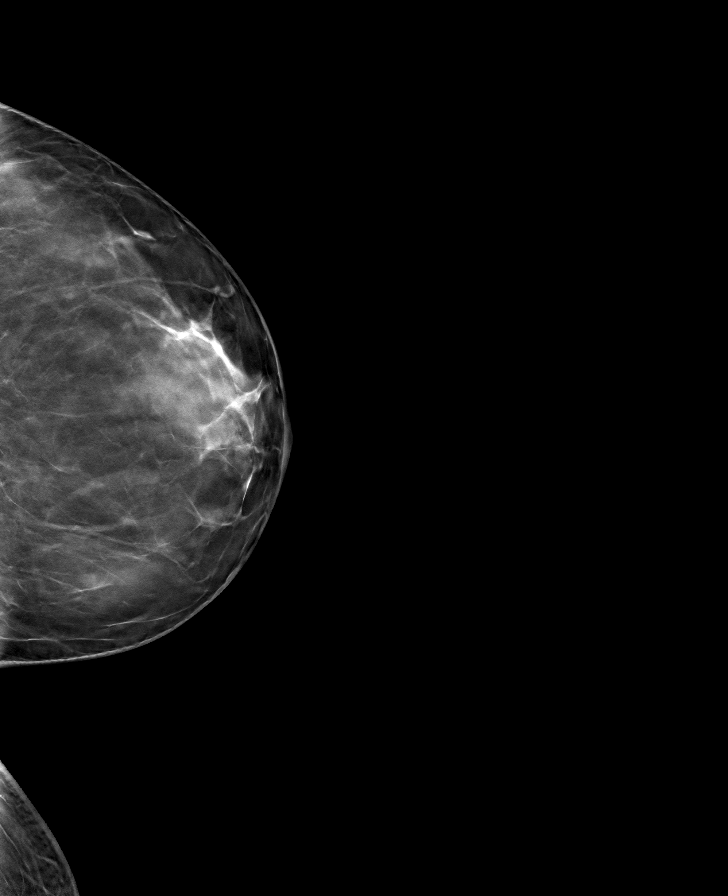

[R MLO tomo · tomo slice 39/76.0]
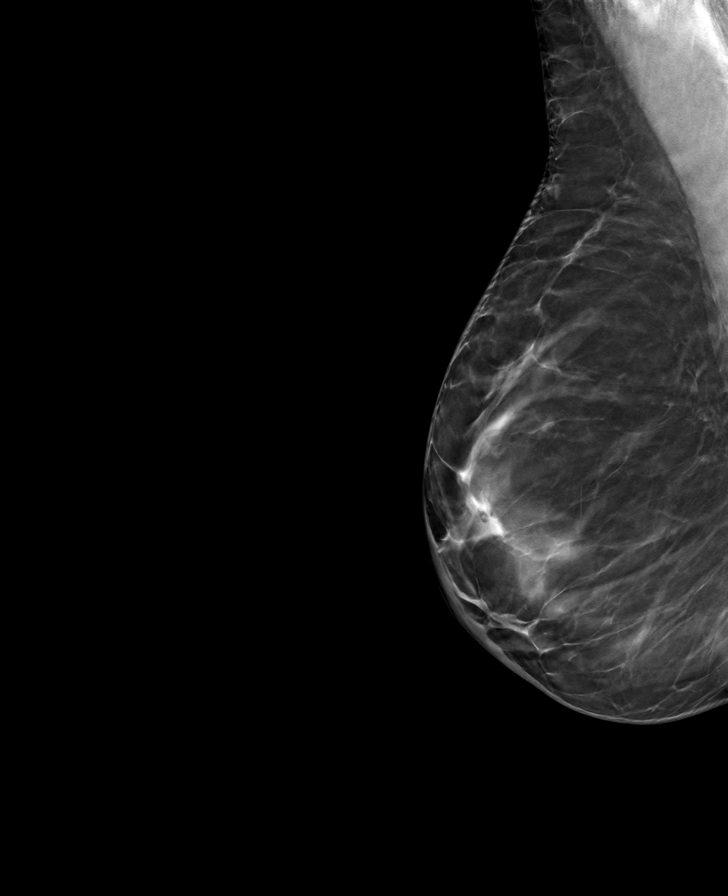

[R CC tomo · tomo slice 35/70.0]
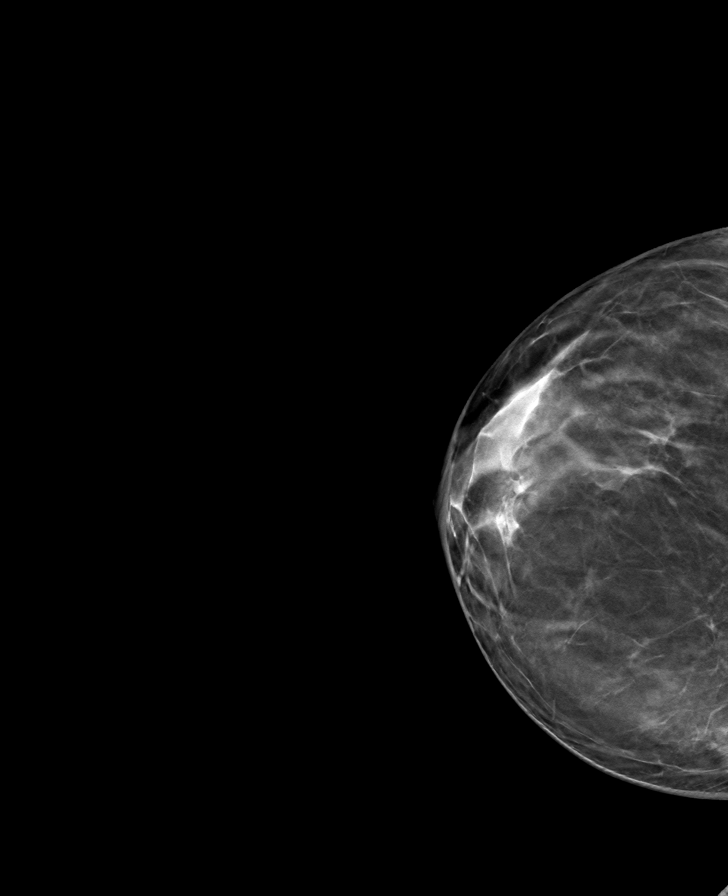

[8 of 24 positions shown; findings below may reference images not displayed]

ACR Breast Density Category b: There are scattered areas of
fibroglandular density.
FINDINGS: There are no findings suspicious for malignancy. Images were
processed with CAD.
IMPRESSION: No mammographic evidence of malignancy. A result letter of this
screening mammogram will be mailed directly to the patient.

RECOMMENDATION:
Screening mammogram in one year. (Code:[id number])

BI-RADS CATEGORY  1: Negative.

## 2021-08-07 ENCOUNTER — Other Ambulatory Visit: Payer: Self-pay | Admitting: Internal Medicine

## 2021-08-07 DIAGNOSIS — Z1231 Encounter for screening mammogram for malignant neoplasm of breast: Secondary | ICD-10-CM

## 2022-02-01 DIAGNOSIS — H33001 Unspecified retinal detachment with retinal break, right eye: Secondary | ICD-10-CM | POA: Diagnosis not present

## 2022-02-01 DIAGNOSIS — Z01818 Encounter for other preprocedural examination: Secondary | ICD-10-CM | POA: Diagnosis not present

## 2022-02-07 DIAGNOSIS — H33001 Unspecified retinal detachment with retinal break, right eye: Secondary | ICD-10-CM | POA: Diagnosis not present

## 2022-03-14 DIAGNOSIS — H33001 Unspecified retinal detachment with retinal break, right eye: Secondary | ICD-10-CM | POA: Diagnosis not present

## 2022-04-25 DIAGNOSIS — H33001 Unspecified retinal detachment with retinal break, right eye: Secondary | ICD-10-CM | POA: Diagnosis not present

## 2022-05-24 ENCOUNTER — Encounter: Payer: Self-pay | Admitting: Gastroenterology

## 2022-06-06 ENCOUNTER — Ambulatory Visit (AMBULATORY_SURGERY_CENTER): Payer: BC Managed Care – PPO

## 2022-06-06 VITALS — Ht 65.0 in | Wt 148.0 lb

## 2022-06-06 DIAGNOSIS — Z83719 Family history of colon polyps, unspecified: Secondary | ICD-10-CM

## 2022-06-06 MED ORDER — NA SULFATE-K SULFATE-MG SULF 17.5-3.13-1.6 GM/177ML PO SOLN: 1.0000 | Freq: Once | ORAL | 0 refills | Status: AC

## 2022-06-06 NOTE — Progress Notes (Signed)

## 2022-06-09 ENCOUNTER — Encounter: Payer: Self-pay | Admitting: Certified Registered Nurse Anesthetist

## 2022-06-11 ENCOUNTER — Encounter: Payer: Self-pay | Admitting: Internal Medicine

## 2022-06-11 ENCOUNTER — Encounter: Payer: Self-pay | Admitting: Gastroenterology

## 2022-06-14 ENCOUNTER — Telehealth: Payer: Self-pay | Admitting: Gastroenterology

## 2022-06-14 NOTE — Telephone Encounter (Signed)
Patient is calling in regards to her prep for her procedure on Monday and states that she picked it up from the pharmacy and didn't receive what was written on her prep instructions. Please advise

## 2022-06-14 NOTE — Telephone Encounter (Signed)
Inbound call from patient states the pharmacy has fixed the problem.

## 2022-06-14 NOTE — Telephone Encounter (Signed)
Pt stated that the pharmacy originally gave her the wrong medication but since then gave her the correct medication for her prep: Pt stated that she is good to go and will see [place] on Monday

## 2022-06-17 ENCOUNTER — Encounter: Payer: Self-pay | Admitting: Gastroenterology

## 2022-06-17 ENCOUNTER — Ambulatory Visit (AMBULATORY_SURGERY_CENTER): Payer: BC Managed Care – PPO | Admitting: Gastroenterology

## 2022-06-17 VITALS — BP 134/80 | HR 68 | Temp 97.7°F | Resp 22 | Ht 65.0 in | Wt 148.0 lb

## 2022-06-17 DIAGNOSIS — Z83719 Family history of colon polyps, unspecified: Secondary | ICD-10-CM

## 2022-06-17 DIAGNOSIS — K635 Polyp of colon: Secondary | ICD-10-CM | POA: Diagnosis not present

## 2022-06-17 DIAGNOSIS — D123 Benign neoplasm of transverse colon: Secondary | ICD-10-CM | POA: Diagnosis not present

## 2022-06-17 DIAGNOSIS — Z86010 Personal history of colonic polyps: Secondary | ICD-10-CM

## 2022-06-17 DIAGNOSIS — Z09 Encounter for follow-up examination after completed treatment for conditions other than malignant neoplasm: Secondary | ICD-10-CM | POA: Diagnosis not present

## 2022-06-17 DIAGNOSIS — Z1211 Encounter for screening for malignant neoplasm of colon: Secondary | ICD-10-CM | POA: Diagnosis not present

## 2022-06-17 MED ORDER — SODIUM CHLORIDE 0.9 % IV SOLN: 500.0000 mL | Freq: Once | INTRAVENOUS | Status: AC

## 2022-06-17 NOTE — Progress Notes (Signed)
Report given to PACU, vss 

## 2022-06-17 NOTE — Patient Instructions (Signed)
Handouts on polyps and diverticulosis given to you today  Await pathology results   YOU HAD AN ENDOSCOPIC PROCEDURE TODAY AT [facility]:   Refer to the procedure report that was given to you for any specific questions about what was found during the examination.  If the procedure report does not answer your questions, please call your gastroenterologist to clarify.  If you requested that your care partner not be given the details of your procedure findings, then the procedure report has been included in a sealed envelope for you to review at your convenience later.  YOU SHOULD EXPECT: Some feelings of bloating in the abdomen. Passage of more gas than usual.  Walking can help get rid of the air that was put into your GI tract during the procedure and reduce the bloating. If you had a lower endoscopy (such as a colonoscopy or flexible sigmoidoscopy) you may notice spotting of blood in your stool or on the toilet paper. If you underwent a bowel prep for your procedure, you may not have a normal bowel movement for a few days.  Please Note:  You might notice some irritation and congestion in your nose or some drainage.  This is from the oxygen used during your procedure.  There is no need for concern and it should clear up in a day or so.  SYMPTOMS TO REPORT IMMEDIATELY:  Following lower endoscopy (colonoscopy or flexible sigmoidoscopy):  Excessive amounts of blood in the stool  Significant tenderness or worsening of abdominal pains  Swelling of the abdomen that is new, acute  Fever of 100F or higher  For urgent or emergent issues, a gastroenterologist can be reached at any hour by calling [phone]. Do not use MyChart messaging for urgent concerns.    DIET:  We do recommend a small meal at first, but then you may proceed to your regular diet.  Drink plenty of fluids but you should avoid alcoholic beverages for 24 hours.  ACTIVITY:  You should plan to take it easy for the  rest of today and you should NOT DRIVE or use heavy machinery until tomorrow (because of the sedation medicines used during the test).    FOLLOW UP: Our staff will call the number listed on your records the next business day following your procedure.  We will call around 7:15- 8:00 am to check on you and address any questions or concerns that you may have regarding the information given to you following your procedure. If we do not reach you, we will leave a message.     If any biopsies were taken you will be contacted by phone or by letter within the next 1-3 weeks.  Please call us at [phone] if you have not heard about the biopsies in 3 weeks.    SIGNATURES/CONFIDENTIALITY: You and/or your care partner have signed paperwork which will be entered into your electronic medical record.  These signatures attest to the fact that that the information above on your After Visit Summary has been reviewed and is understood.  Full responsibility of the confidentiality of this discharge information lies with you and/or your care-partner.

## 2022-06-17 NOTE — Progress Notes (Signed)
Pt's states no medical or surgical changes since previsit or office visit. 

## 2022-06-17 NOTE — Op Note (Signed)
[place] Patient Name: Morgan Bradley Procedure Date: 06/17/2022 3:50 PM MRN: [id number] Endoscopist: Jackquline Denmark , MD, [id number] Age: 61 Referring MD:  Date of Birth: 17-Dec-1960 Gender: Female Account #: [id number] Procedure:                Colonoscopy Indications:              High risk colon cancer surveillance: Personal                            history of colonic polyps. FH of colon polyps (mom)                            s/p R hemicolectomy. Medicines:                Monitored Anesthesia Care Procedure:                Pre-Anesthesia Assessment:                           - Prior to the procedure, a History and Physical                            was performed, and patient medications and                            allergies were reviewed. The patient's tolerance of                            previous anesthesia was also reviewed. The risks                            and benefits of the procedure and the sedation                            options and risks were discussed with the patient.                            All questions were answered, and informed consent                            was obtained. Prior Anticoagulants: The patient has                            taken no anticoagulant or antiplatelet agents. ASA                            Grade Assessment: II - A patient with mild systemic                            disease. After reviewing the risks and benefits,                            the patient was deemed in satisfactory condition to  undergo the procedure.                           After obtaining informed consent, the colonoscope                            was passed under direct vision. Throughout the                            procedure, the patient's blood pressure, pulse, and                            oxygen saturations were monitored continuously. The                            PCF-HQ190L Colonoscope was introduced  through the                            anus and advanced to the 2 cm into the ileum. The                            colonoscopy was performed without difficulty. The                            patient tolerated the procedure well. The quality                            of the bowel preparation was good. The terminal                            ileum, ileocecal valve, appendiceal orifice, and                            rectum were photographed. Scope In: 3:58:50 PM Scope Out: 4:15:56 PM Scope Withdrawal Time: 0 hours 12 minutes 22 seconds  Total Procedure Duration: 0 hours 17 minutes 6 seconds  Findings:                 Three sessile polyps were found in the proximal                            transverse colon and mid transverse colon. The                            polyps were 2 to 6 mm in size. These polyps were                            removed with a cold snare. Resection and retrieval                            were complete.                           Rare sigmoid diverticulosis were noted. Internal  hemorrhoids were found during retroflexion. The                            hemorrhoids were small and Grade I (internal                            hemorrhoids that do not prolapse).                           The terminal ileum appeared normal.                           The exam was otherwise without abnormality on                            direct and retroflexion views. Complications:            No immediate complications. Estimated Blood Loss:     Estimated blood loss: none. Impression:               - Three 2 to 6 mm polyps in the proximal transverse                            colon and in the mid transverse colon, removed with                            a cold snare. Resected and retrieved.                           - Minimal sigmoid diverticulosis.                           - The examined portion of the ileum was normal.                           - The  examination was otherwise normal on direct                            and retroflexion views. Recommendation:           - Patient has a contact number available for                            emergencies. The signs and symptoms of potential                            delayed complications were discussed with the                            patient. Return to normal activities tomorrow.                            Written discharge instructions were provided to the                            patient.                           -  Resume previous diet.                           - Continue present medications.                           - Await pathology results.                           - Repeat colonoscopy for surveillance based on                            pathology results. Likely in 5-7 yrs.                           - The findings and recommendations were discussed                            with the patient's family. Jackquline Denmark, MD 06/17/2022 4:21:16 PM This report has been signed electronically.

## 2022-06-17 NOTE — Progress Notes (Signed)
[facility] History and Physical   Primary Care Physician:  Nicoletta Dress, MD   Reason for Procedure: Personal history of serrated colonic polyps 2020. FH of colonic polyps (mom) s/p R hemicolectomy  Plan:    colon     HPI: Morgan Bradley is a 61 y.o. female    Past Medical History:  Diagnosis Date   Allergy    seasonal   Cataract    bilateral-removed   Hypertension     Past Surgical History:  Procedure Laterality Date   cataracts     bilateral   COLONOSCOPY     POLYPECTOMY     RETINAL DETACHMENT SURGERY     TOTAL HIP ARTHROPLASTY Right 02/10/2020   Procedure: TOTAL HIP ARTHROPLASTY ANTERIOR APPROACH;  Surgeon: Mcarthur Rossetti, MD;  Location: [facility];  Service: Orthopedics;  Laterality: Right;   TUBAL LIGATION      Prior to Admission medications   Medication Sig Start Date End Date Taking? Authorizing Provider  Ascorbic Acid (VITAMIN C) 1000 MG tablet Take 1,000 mg by mouth daily.   Yes [provider]  aspirin 81 MG chewable tablet Chew 1 tablet (81 mg total) by mouth 2 (two) times daily. 02/12/20  Yes Mcarthur Rossetti, MD  atorvastatin (LIPITOR) 20 MG tablet Take 20 mg by mouth daily. 01/13/20  Yes [provider]  calcium carbonate (OSCAL) 1500 (600 Ca) MG TABS tablet Take 600 mg by mouth daily with breakfast.   Yes [provider]  cetirizine (ZYRTEC) 10 MG tablet Take 10 mg by mouth daily.   Yes [provider]  lisinopril (ZESTRIL) 10 MG tablet Take 10 mg by mouth daily.  02/19/19  Yes [provider]  magnesium oxide (MAG-OX) 400 MG tablet Take 400 mg by mouth daily.   Yes [provider]  Misc Natural Products (PUMPKIN SEED OIL) CAPS Take 1 capsule by mouth daily.   Yes [provider]  polyvinyl alcohol (LIQUIFILM TEARS) 1.4 % ophthalmic solution Place 1 drop into both eyes as needed for dry eyes.   Yes [provider]  aspirin-acetaminophen-caffeine (EXCEDRIN  MIGRAINE) [id number] MG tablet Take 1 tablet by mouth every 6 (six) hours as needed for headache. Patient not taking: Reported on 06/06/2022    [provider]  methocarbamol (ROBAXIN) 500 MG tablet Take 1 tablet (500 mg total) by mouth every 6 (six) hours as needed for muscle spasms. Patient not taking: Reported on 06/06/2022 02/12/20   Mcarthur Rossetti, MD    Current Outpatient Medications  Medication Sig Dispense Refill   Ascorbic Acid (VITAMIN C) 1000 MG tablet Take 1,000 mg by mouth daily.     aspirin 81 MG chewable tablet Chew 1 tablet (81 mg total) by mouth 2 (two) times daily. 30 tablet 0   atorvastatin (LIPITOR) 20 MG tablet Take 20 mg by mouth daily.     calcium carbonate (OSCAL) 1500 (600 Ca) MG TABS tablet Take 600 mg by mouth daily with breakfast.     cetirizine (ZYRTEC) 10 MG tablet Take 10 mg by mouth daily.     lisinopril (ZESTRIL) 10 MG tablet Take 10 mg by mouth daily.      magnesium oxide (MAG-OX) 400 MG tablet Take 400 mg by mouth daily.     Misc Natural Products (PUMPKIN SEED OIL) CAPS Take 1 capsule by mouth daily.     polyvinyl alcohol (LIQUIFILM TEARS) 1.4 % ophthalmic solution Place 1 drop into both eyes as needed for dry eyes.  aspirin-acetaminophen-caffeine (EXCEDRIN MIGRAINE) 250-250-65 MG tablet Take 1 tablet by mouth every 6 (six) hours as needed for headache. (Patient not taking: Reported on 06/06/2022)     methocarbamol (ROBAXIN) 500 MG tablet Take 1 tablet (500 mg total) by mouth every 6 (six) hours as needed for muscle spasms. (Patient not taking: Reported on 06/06/2022) 40 tablet 1   Current Facility-Administered Medications  Medication Dose Route Frequency Provider Last Rate Last Admin   0.9 %  sodium chloride infusion  500 mL Intravenous Once Jackquline Denmark, MD        Allergies as of 06/17/2022 - Review Complete 06/17/2022  Allergen Reaction Noted   Sulfa antibiotics Hives 04/07/2019   Ciprofloxacin Rash 02/10/2020    Family History   Problem Relation Age of Onset   Colon polyps Mother    Colon cancer Neg Hx    Esophageal cancer Neg Hx    Rectal cancer Neg Hx    Stomach cancer Neg Hx     Social History   Socioeconomic History   Marital status: Married    Spouse name: Not on file   Number of children: Not on file   Years of education: Not on file   Highest education level: Not on file  Occupational History   Not on file  Tobacco Use   Smoking status: Never   Smokeless tobacco: Never  Vaping Use   Vaping Use: Never used  Substance and Sexual Activity   Alcohol use: Never   Drug use: Never   Sexual activity: Not on file  Other Topics Concern   Not on file  Social History Narrative   Not on file   Social Determinants of Health   Financial Resource Strain: Not on file  Food Insecurity: Not on file  Transportation Needs: Not on file  Physical Activity: Not on file  Stress: Not on file  Social Connections: Not on file  Intimate Partner Violence: Not on file    Review of Systems: Positive for none All other review of systems negative except as mentioned in the HPI.  Physical Exam: Vital signs in last 24 hours: '@VSRANGES'$ @   General:   Alert,  Well-developed, well-nourished, pleasant and cooperative in NAD Lungs:  Clear throughout to auscultation.   Heart:  Regular rate and rhythm; no murmurs, clicks, rubs,  or gallops. Abdomen:  Soft, nontender and nondistended. Normal bowel sounds.   Neuro/Psych:  Alert and cooperative. Normal mood and affect. A and O x 3    No significant changes were identified.  The patient continues to be an appropriate candidate for the planned procedure and anesthesia.   Carmell Austria, MD. [facility] 06/17/2022 3:46 PM@

## 2022-06-18 ENCOUNTER — Telehealth: Payer: Self-pay | Admitting: *Deleted

## 2022-06-18 NOTE — Telephone Encounter (Signed)
  Follow up Call-     06/17/2022    3:14 PM  Call back number  Post procedure Call Back phone  # [phone]  Permission to leave phone message Yes     Patient questions:  Do you have a fever, pain , or abdominal swelling? No. Pain Score  0 *  Have you tolerated food without any problems? Yes.    Have you been able to return to your normal activities? Yes.    Do you have any questions about your discharge instructions: Diet   No. Medications  No. Follow up visit  No.  Do you have questions or concerns about your Care? No.  Actions: * If pain score is 4 or above: No action needed, pain <4.

## 2022-06-21 DIAGNOSIS — E785 Hyperlipidemia, unspecified: Secondary | ICD-10-CM | POA: Diagnosis not present

## 2022-06-21 DIAGNOSIS — E559 Vitamin D deficiency, unspecified: Secondary | ICD-10-CM | POA: Diagnosis not present

## 2022-06-21 DIAGNOSIS — Z0000 Encounter for general adult medical examination without abnormal findings: Secondary | ICD-10-CM | POA: Diagnosis not present

## 2022-06-21 DIAGNOSIS — Z23 Encounter for immunization: Secondary | ICD-10-CM | POA: Diagnosis not present

## 2022-06-21 DIAGNOSIS — D489 Neoplasm of uncertain behavior, unspecified: Secondary | ICD-10-CM | POA: Diagnosis not present

## 2022-06-21 DIAGNOSIS — E538 Deficiency of other specified B group vitamins: Secondary | ICD-10-CM | POA: Diagnosis not present

## 2022-06-28 DIAGNOSIS — D485 Neoplasm of uncertain behavior of skin: Secondary | ICD-10-CM | POA: Diagnosis not present

## 2022-06-30 ENCOUNTER — Encounter: Payer: Self-pay | Admitting: Gastroenterology

## 2022-07-10 ENCOUNTER — Other Ambulatory Visit: Payer: Self-pay | Admitting: Internal Medicine

## 2022-07-10 DIAGNOSIS — Z1231 Encounter for screening mammogram for malignant neoplasm of breast: Secondary | ICD-10-CM

## 2022-08-01 DIAGNOSIS — H35373 Puckering of macula, bilateral: Secondary | ICD-10-CM | POA: Diagnosis not present

## 2022-08-05 ENCOUNTER — Ambulatory Visit: Payer: BC Managed Care – PPO

## 2022-09-03 ENCOUNTER — Ambulatory Visit
Admission: RE | Admit: 2022-09-03 | Discharge: 2022-09-03 | Disposition: A | Discharge status: Home / Self Care | Payer: BC Managed Care – PPO | Source: Ambulatory Visit | Attending: Internal Medicine | Admitting: Internal Medicine

## 2022-09-03 DIAGNOSIS — Z1231 Encounter for screening mammogram for malignant neoplasm of breast: Secondary | ICD-10-CM

## 2023-01-25 DIAGNOSIS — B309 Viral conjunctivitis, unspecified: Secondary | ICD-10-CM | POA: Diagnosis not present

## 2023-02-02 DIAGNOSIS — H10029 Other mucopurulent conjunctivitis, unspecified eye: Secondary | ICD-10-CM | POA: Diagnosis not present

## 2023-02-17 DIAGNOSIS — R21 Rash and other nonspecific skin eruption: Secondary | ICD-10-CM | POA: Diagnosis not present

## 2023-06-27 DIAGNOSIS — R7301 Impaired fasting glucose: Secondary | ICD-10-CM | POA: Diagnosis not present

## 2023-06-27 DIAGNOSIS — Z13220 Encounter for screening for lipoid disorders: Secondary | ICD-10-CM | POA: Diagnosis not present

## 2023-06-27 DIAGNOSIS — I10 Essential (primary) hypertension: Secondary | ICD-10-CM | POA: Diagnosis not present

## 2023-06-27 DIAGNOSIS — E785 Hyperlipidemia, unspecified: Secondary | ICD-10-CM | POA: Diagnosis not present

## 2023-06-27 DIAGNOSIS — Z0000 Encounter for general adult medical examination without abnormal findings: Secondary | ICD-10-CM | POA: Diagnosis not present

## 2023-06-27 DIAGNOSIS — E559 Vitamin D deficiency, unspecified: Secondary | ICD-10-CM | POA: Diagnosis not present

## 2023-06-27 DIAGNOSIS — Z131 Encounter for screening for diabetes mellitus: Secondary | ICD-10-CM | POA: Diagnosis not present

## 2023-06-27 DIAGNOSIS — Z23 Encounter for immunization: Secondary | ICD-10-CM | POA: Diagnosis not present

## 2023-06-30 ENCOUNTER — Other Ambulatory Visit: Payer: Self-pay | Admitting: Internal Medicine

## 2023-06-30 DIAGNOSIS — Z1231 Encounter for screening mammogram for malignant neoplasm of breast: Secondary | ICD-10-CM

## 2023-07-22 ENCOUNTER — Other Ambulatory Visit: Payer: Self-pay

## 2023-08-07 DIAGNOSIS — H35373 Puckering of macula, bilateral: Secondary | ICD-10-CM | POA: Diagnosis not present

## 2023-09-05 ENCOUNTER — Ambulatory Visit
Admission: RE | Admit: 2023-09-05 | Discharge: 2023-09-05 | Disposition: A | Discharge status: Home / Self Care | Payer: BC Managed Care – PPO | Source: Ambulatory Visit | Attending: Internal Medicine | Admitting: Internal Medicine

## 2023-09-05 DIAGNOSIS — Z1231 Encounter for screening mammogram for malignant neoplasm of breast: Secondary | ICD-10-CM

## 2023-12-12 DIAGNOSIS — H26491 Other secondary cataract, right eye: Secondary | ICD-10-CM | POA: Diagnosis not present

## 2024-02-10 DIAGNOSIS — W19XXXA Unspecified fall, initial encounter: Secondary | ICD-10-CM | POA: Diagnosis not present

## 2024-02-10 DIAGNOSIS — M25511 Pain in right shoulder: Secondary | ICD-10-CM | POA: Diagnosis not present

## 2024-02-10 DIAGNOSIS — Z043 Encounter for examination and observation following other accident: Secondary | ICD-10-CM | POA: Diagnosis not present

## 2024-02-10 DIAGNOSIS — S0001XA Abrasion of scalp, initial encounter: Secondary | ICD-10-CM | POA: Diagnosis not present

## 2024-02-10 DIAGNOSIS — S0003XA Contusion of scalp, initial encounter: Secondary | ICD-10-CM | POA: Diagnosis not present

## 2024-02-10 DIAGNOSIS — W010XXA Fall on same level from slipping, tripping and stumbling without subsequent striking against object, initial encounter: Secondary | ICD-10-CM | POA: Diagnosis not present

## 2024-02-10 DIAGNOSIS — S0511XA Contusion of eyeball and orbital tissues, right eye, initial encounter: Secondary | ICD-10-CM | POA: Diagnosis not present

## 2024-02-27 DIAGNOSIS — I10 Essential (primary) hypertension: Secondary | ICD-10-CM | POA: Diagnosis not present

## 2024-03-29 DIAGNOSIS — I10 Essential (primary) hypertension: Secondary | ICD-10-CM | POA: Diagnosis not present

## 2024-04-28 DIAGNOSIS — I10 Essential (primary) hypertension: Secondary | ICD-10-CM | POA: Diagnosis not present

## 2024-05-29 DIAGNOSIS — I10 Essential (primary) hypertension: Secondary | ICD-10-CM | POA: Diagnosis not present

## 2024-06-28 DIAGNOSIS — Z1331 Encounter for screening for depression: Secondary | ICD-10-CM | POA: Diagnosis not present

## 2024-06-28 DIAGNOSIS — Z0000 Encounter for general adult medical examination without abnormal findings: Secondary | ICD-10-CM | POA: Diagnosis not present

## 2024-06-28 DIAGNOSIS — Z23 Encounter for immunization: Secondary | ICD-10-CM | POA: Diagnosis not present

## 2024-07-28 ENCOUNTER — Other Ambulatory Visit: Payer: Self-pay | Admitting: Internal Medicine

## 2024-07-28 DIAGNOSIS — Z1231 Encounter for screening mammogram for malignant neoplasm of breast: Secondary | ICD-10-CM

## 2024-09-06 ENCOUNTER — Ambulatory Visit
